# Patient Record
Sex: Female | Born: 1950 | Race: Black or African American | Hispanic: No | Marital: Single | Smoking: Never smoker
Health system: Southern US, Community
[De-identification: ages and names within clinical notes are randomized; demographics above are authoritative.]

## PROBLEM LIST (undated history)

## (undated) DIAGNOSIS — E079 Disorder of thyroid, unspecified: Secondary | ICD-10-CM

## (undated) DIAGNOSIS — E119 Type 2 diabetes mellitus without complications: Secondary | ICD-10-CM

## (undated) DIAGNOSIS — I10 Essential (primary) hypertension: Secondary | ICD-10-CM

## (undated) HISTORY — PX: THYROIDECTOMY: SHX17

---

## 2003-11-04 ENCOUNTER — Ambulatory Visit: Payer: Self-pay | Admitting: Family Medicine

## 2003-11-09 ENCOUNTER — Ambulatory Visit: Payer: Self-pay | Admitting: *Deleted

## 2003-11-09 ENCOUNTER — Ambulatory Visit: Payer: Self-pay | Admitting: Family Medicine

## 2006-03-13 ENCOUNTER — Emergency Department (HOSPITAL_COMMUNITY): Admission: EM | Admit: 2006-03-13 | Discharge: 2006-03-14 | Payer: Self-pay | Admitting: Emergency Medicine

## 2006-06-07 ENCOUNTER — Emergency Department (HOSPITAL_COMMUNITY): Admission: EM | Admit: 2006-06-07 | Discharge: 2006-06-07 | Payer: Self-pay | Admitting: Family Medicine

## 2017-04-21 ENCOUNTER — Encounter (HOSPITAL_COMMUNITY): Payer: Self-pay | Admitting: Emergency Medicine

## 2017-04-21 DIAGNOSIS — R1031 Right lower quadrant pain: Secondary | ICD-10-CM | POA: Diagnosis present

## 2017-04-21 DIAGNOSIS — E039 Hypothyroidism, unspecified: Secondary | ICD-10-CM | POA: Diagnosis not present

## 2017-04-21 DIAGNOSIS — I10 Essential (primary) hypertension: Secondary | ICD-10-CM | POA: Insufficient documentation

## 2017-04-21 DIAGNOSIS — E119 Type 2 diabetes mellitus without complications: Secondary | ICD-10-CM | POA: Diagnosis not present

## 2017-04-21 LAB — URINALYSIS, ROUTINE W REFLEX MICROSCOPIC
BILIRUBIN URINE: NEGATIVE
Bacteria, UA: NONE SEEN
Glucose, UA: NEGATIVE mg/dL
Hgb urine dipstick: NEGATIVE
Ketones, ur: NEGATIVE mg/dL
Nitrite: NEGATIVE
PH: 5 (ref 5.0–8.0)
Protein, ur: NEGATIVE mg/dL
SPECIFIC GRAVITY, URINE: 1.017 (ref 1.005–1.030)

## 2017-04-21 NOTE — ED Triage Notes (Signed)
Reports having right side flank pain continuously for the last three months.  Was seen in [place] diagnosed with muscle spasms.  Reports continued pain even after using oxycodone, ibuprofen, and lidocaine patch.

## 2017-04-21 NOTE — ED Notes (Signed)
Pt unable to void at this time. 

## 2017-04-22 ENCOUNTER — Emergency Department (HOSPITAL_COMMUNITY): Payer: Medicaid - Out of State

## 2017-04-22 ENCOUNTER — Emergency Department (HOSPITAL_COMMUNITY)
Admission: EM | Admit: 2017-04-22 | Discharge: 2017-04-22 | Disposition: A | Discharge status: Home / Self Care | Payer: Medicaid - Out of State | Attending: Emergency Medicine | Admitting: Emergency Medicine

## 2017-04-22 ENCOUNTER — Encounter (HOSPITAL_COMMUNITY): Payer: Self-pay

## 2017-04-22 DIAGNOSIS — R109 Unspecified abdominal pain: Secondary | ICD-10-CM

## 2017-04-22 HISTORY — DX: Type 2 diabetes mellitus without complications: E11.9

## 2017-04-22 HISTORY — DX: Disorder of thyroid, unspecified: E07.9

## 2017-04-22 HISTORY — DX: Essential (primary) hypertension: I10

## 2017-04-22 LAB — CBC
HCT: 33.6 % — ABNORMAL LOW (ref 36.0–46.0)
Hemoglobin: 11 g/dL — ABNORMAL LOW (ref 12.0–15.0)
MCH: 30.6 pg (ref 26.0–34.0)
MCHC: 32.7 g/dL (ref 30.0–36.0)
MCV: 93.3 fL (ref 78.0–100.0)
PLATELETS: 354 10*3/uL (ref 150–400)
RBC: 3.6 MIL/uL — AB (ref 3.87–5.11)
RDW: 12.9 % (ref 11.5–15.5)
WBC: 6.1 10*3/uL (ref 4.0–10.5)

## 2017-04-22 LAB — COMPREHENSIVE METABOLIC PANEL
ALBUMIN: 3.9 g/dL (ref 3.5–5.0)
ALT: 14 U/L (ref 14–54)
AST: 39 U/L (ref 15–41)
Alkaline Phosphatase: 36 U/L — ABNORMAL LOW (ref 38–126)
Anion gap: 8 (ref 5–15)
BUN: 16 mg/dL (ref 6–20)
CHLORIDE: 103 mmol/L (ref 101–111)
CO2: 26 mmol/L (ref 22–32)
CREATININE: 0.86 mg/dL (ref 0.44–1.00)
Calcium: 8.9 mg/dL (ref 8.9–10.3)
GFR calc Af Amer: >60 mL/min (ref >60)
Glucose, Bld: 143 mg/dL — ABNORMAL HIGH (ref 65–99)
POTASSIUM: 5.8 mmol/L — AB (ref 3.5–5.1)
SODIUM: 137 mmol/L (ref 135–145)
Total Bilirubin: 1.1 mg/dL (ref 0.3–1.2)
Total Protein: 5.9 g/dL — ABNORMAL LOW (ref 6.5–8.1)

## 2017-04-22 MED ORDER — MORPHINE SULFATE (PF) 4 MG/ML IV SOLN
4.0000 mg | Freq: Once | INTRAVENOUS | Status: AC
  Administered 2017-04-22: 4 mg via INTRAVENOUS
  Filled 2017-04-22: qty 1

## 2017-04-22 MED ORDER — SODIUM CHLORIDE 0.9 % IV BOLUS (SEPSIS)
1000.0000 mL | Freq: Once | INTRAVENOUS | Status: AC
  Administered 2017-04-22: 1000 mL via INTRAVENOUS

## 2017-04-22 MED ORDER — IOPAMIDOL (ISOVUE-300) INJECTION 61%
INTRAVENOUS | Status: AC
  Administered 2017-04-22: 100 mL
  Filled 2017-04-22: qty 100

## 2017-04-22 NOTE — ED Provider Notes (Signed)
[facility] Provider Note   CSN: [id number] Arrival date & time: 04/21/17  2232     History   Chief Complaint Chief Complaint  Patient presents with  . Flank Pain    HPI Amber Hanna is a 67 y.o. female.  HPI Patient is a 67 year old female presents to the emergency department with complaints of 2 months of constant right flank and right sided pain.  She states it seems worse at night.  She reports that she has had this worked up in [place] and no clear etiology is found.  She recently moved down to live with her daughter.  She reports that even light touch seems to bother her skin in her right lower quadrant.  No recent shingles or rash outbreak across to her right abdomen and right flank.  No history of kidney stones.  Denies dysuria or urinary frequency.  No chest pain or shortness of breath.  Denies fevers or chills.  Denies nausea vomiting diarrhea.  Appetite is been normal.  Stools been normal.  No discoloration of her stools.  Pain is mild to moderate in severity at this time.  Currently without a primary care physician in the region   Past Medical History:  Diagnosis Date  . Diabetes mellitus without complication (HCC)   . Hypertension   . Thyroid disease     There are no active problems to display for this patient.   Past Surgical History:  Procedure Laterality Date  . THYROIDECTOMY      OB History    No data available       Home Medications    Prior to Admission medications   Not on File    Family History No family history on file.  Social History Social History   Tobacco Use  . Smoking status: Never Smoker  . Smokeless tobacco: Never Used  Substance Use Topics  . Alcohol use: No    Frequency: Never  . Drug use: No     Allergies   Penicillins   Review of Systems Review of Systems  All other systems reviewed and are negative.    Physical Exam Updated Vital Signs BP (!) 141/87   Pulse 74    Temp 99.1 F (37.3 C) (Oral)   Resp 16   Ht 5\' 10"  (1.778 m)   Wt 77.1 kg (170 lb)   SpO2 98%   BMI 24.39 kg/m   Physical Exam  Constitutional: She is oriented to person, place, and time. She appears well-developed and well-nourished. No distress.  HENT:  Head: Normocephalic and atraumatic.  Eyes: EOM are normal.  Neck: Normal range of motion.  Cardiovascular: Normal rate, regular rhythm and normal heart sounds.  Pulmonary/Chest: Effort normal and breath sounds normal.  Abdominal: Soft. She exhibits no distension. There is no tenderness.  Musculoskeletal: Normal range of motion.  Neurological: She is alert and oriented to person, place, and time.  Skin: Skin is warm and dry.  No signs of zoster across her right flank or right abdomen  Psychiatric: She has a normal mood and affect. Judgment normal.  Nursing note and vitals reviewed.    ED Treatments / Results  Labs (all labs ordered are listed, but only abnormal results are displayed) Labs Reviewed  URINALYSIS, ROUTINE W REFLEX MICROSCOPIC - Abnormal; Notable for the following components:      Result Value   Leukocytes, UA SMALL (*)    Squamous Epithelial / LPF 0-5 (*)  All other components within normal limits  CBC - Abnormal; Notable for the following components:   RBC 3.60 (*)    Hemoglobin 11.0 (*)    HCT 33.6 (*)    All other components within normal limits  COMPREHENSIVE METABOLIC PANEL - Abnormal; Notable for the following components:   Potassium 5.8 (*)    Glucose, Bld 143 (*)    Total Protein 5.9 (*)    Alkaline Phosphatase 36 (*)    All other components within normal limits  URINE CULTURE    EKG  EKG Interpretation None       Radiology Ct Abdomen Pelvis W Contrast  Result Date: 04/22/2017 CLINICAL DATA:  Right abdominal/flank pain for 3 months. EXAM: CT ABDOMEN AND PELVIS WITH CONTRAST TECHNIQUE: Multidetector CT imaging of the abdomen and pelvis was performed using the standard protocol following  bolus administration of intravenous contrast. CONTRAST:  ISOVUE-300 IOPAMIDOL (ISOVUE-300) INJECTION 61% COMPARISON:  None. FINDINGS: Lower chest: Scattered basilar atelectasis. No pleural fluid or confluent consolidation. Hepatobiliary: No focal liver abnormality is seen. No gallstones, gallbladder wall thickening, or biliary dilatation. Pancreas: No ductal dilatation or inflammation. Spleen: Normal in size without focal abnormality. Adrenals/Urinary Tract: Normal adrenal glands. No hydronephrosis or perinephric edema. Homogeneous renal enhancement with symmetric excretion on delayed phase imaging. Urinary bladder is partially distended without wall thickening. Stomach/Bowel: Lack of enteric contrast limits bowel assessment. Stomach is nondistended. No bowel wall thickening, inflammatory change or obstruction. Low lying cecum in the midline pelvis. Normal appendix courses into the left abdomen. Moderate colonic stool burden. No colonic wall thickening or inflammatory change. Vascular/Lymphatic: No significant vascular findings are present. No enlarged abdominal or pelvic lymph nodes. Reproductive: Uterus and bilateral adnexa are unremarkable. Other: Peripherally calcified 2 cm density abutting the anti mesenteric border of the cecum in the pelvis is nonspecific, no associated inflammatory change. No free air, free fluid, or intra-abdominal fluid collection. Tiny fat containing umbilical hernia. Musculoskeletal: There are no acute or suspicious osseous abnormalities. Facet arthropathy in the lower lumbar spine. IMPRESSION: 1. No acute abnormality or explanation for abdominal pain. 2. Low lying cecum in the midline of the pelvis. Peripherally calcified density adjacent to the anterior cecum is nonspecific but has benign imaging features. This may reflect sequela of fat necrosis or epiploic appendagitis. No inflammatory change at this time. Electronically Signed   By: Rubye Oaks M.D.   On: 04/22/2017 03:26     Procedures Procedures (including critical care time)  Medications Ordered in ED Medications  morphine 4 MG/ML injection 4 mg (4 mg Intravenous Given 04/22/17 0213)  sodium chloride 0.9 % bolus 1,000 mL (0 mLs Intravenous Stopped 04/22/17 0342)  iopamidol (ISOVUE-300) 61 % injection (100 mLs  Contrast Given 04/22/17 0256)     Initial Impression / Assessment and Plan / ED Course  I have reviewed the triage vital signs and the nursing notes.  Pertinent labs & imaging results that were available during my care of the patient were reviewed by me and considered in my medical decision making (see chart for details).     No signs of shingles at this time.  Workup in the emergency department without significant abnormality.  She reports even light touch seems to bother her skin in her right lower quadrant.  This is some sort of neuropathic type pain.  Patient is requesting a GI referral.  She may benefit from colonoscopy.  She will need a primary care physician and will need to continue working on this discomfort  at home.  I do not think she needs additional testing at this time.  She does not need acute hospitalization.  Patient and daughter understand to return to the emergency department for new or worsening symptoms  Final Clinical Impressions(s) / ED Diagnoses   Final diagnoses:  Right flank pain    ED Discharge Orders    None       Azalia Bilis, MD 04/22/17 [phone]

## 2017-04-22 NOTE — ED Notes (Signed)
Pt understood dc material. NAD noted. 

## 2017-04-22 NOTE — ED Notes (Signed)
Patient transported to CT 

## 2017-04-23 ENCOUNTER — Encounter: Payer: Self-pay | Admitting: Physician Assistant

## 2017-04-23 LAB — URINE CULTURE: CULTURE: NO GROWTH

## 2017-04-24 ENCOUNTER — Other Ambulatory Visit: Payer: Self-pay | Admitting: Family Medicine

## 2017-04-24 ENCOUNTER — Ambulatory Visit
Admission: RE | Admit: 2017-04-24 | Discharge: 2017-04-24 | Disposition: A | Discharge status: Home / Self Care | Payer: PRIVATE HEALTH INSURANCE | Source: Ambulatory Visit | Attending: Family Medicine | Admitting: Family Medicine

## 2017-04-24 DIAGNOSIS — M545 Low back pain: Secondary | ICD-10-CM

## 2017-04-27 ENCOUNTER — Encounter (HOSPITAL_COMMUNITY): Payer: Self-pay | Admitting: Emergency Medicine

## 2017-04-27 ENCOUNTER — Other Ambulatory Visit: Payer: Self-pay

## 2017-04-27 ENCOUNTER — Emergency Department (HOSPITAL_COMMUNITY)
Admission: EM | Admit: 2017-04-27 | Discharge: 2017-04-27 | Disposition: A | Discharge status: Home / Self Care | Payer: Medicaid - Out of State | Attending: Emergency Medicine | Admitting: Emergency Medicine

## 2017-04-27 DIAGNOSIS — R1031 Right lower quadrant pain: Secondary | ICD-10-CM | POA: Diagnosis present

## 2017-04-27 DIAGNOSIS — E119 Type 2 diabetes mellitus without complications: Secondary | ICD-10-CM | POA: Insufficient documentation

## 2017-04-27 DIAGNOSIS — Z7984 Long term (current) use of oral hypoglycemic drugs: Secondary | ICD-10-CM | POA: Insufficient documentation

## 2017-04-27 DIAGNOSIS — M792 Neuralgia and neuritis, unspecified: Secondary | ICD-10-CM | POA: Diagnosis not present

## 2017-04-27 DIAGNOSIS — I10 Essential (primary) hypertension: Secondary | ICD-10-CM | POA: Diagnosis not present

## 2017-04-27 LAB — COMPREHENSIVE METABOLIC PANEL
ALBUMIN: 4.1 g/dL (ref 3.5–5.0)
ALT: 15 U/L (ref 14–54)
ANION GAP: 11 (ref 5–15)
AST: 17 U/L (ref 15–41)
Alkaline Phosphatase: 39 U/L (ref 38–126)
BILIRUBIN TOTAL: 0.8 mg/dL (ref 0.3–1.2)
BUN: 13 mg/dL (ref 6–20)
CO2: 24 mmol/L (ref 22–32)
Calcium: 9.3 mg/dL (ref 8.9–10.3)
Chloride: 104 mmol/L (ref 101–111)
Creatinine, Ser: 0.84 mg/dL (ref 0.44–1.00)
Glucose, Bld: 149 mg/dL — ABNORMAL HIGH (ref 65–99)
POTASSIUM: 4.5 mmol/L (ref 3.5–5.1)
Sodium: 139 mmol/L (ref 135–145)
TOTAL PROTEIN: 6.8 g/dL (ref 6.5–8.1)

## 2017-04-27 LAB — CBC
HEMATOCRIT: 35.6 % — AB (ref 36.0–46.0)
HEMOGLOBIN: 11.6 g/dL — AB (ref 12.0–15.0)
MCH: 30 pg (ref 26.0–34.0)
MCHC: 32.6 g/dL (ref 30.0–36.0)
MCV: 92 fL (ref 78.0–100.0)
Platelets: 360 10*3/uL (ref 150–400)
RBC: 3.87 MIL/uL (ref 3.87–5.11)
RDW: 12.6 % (ref 11.5–15.5)
WBC: 6.4 10*3/uL (ref 4.0–10.5)

## 2017-04-27 LAB — URINALYSIS, ROUTINE W REFLEX MICROSCOPIC
BACTERIA UA: NONE SEEN
Bilirubin Urine: NEGATIVE
GLUCOSE, UA: NEGATIVE mg/dL
HGB URINE DIPSTICK: NEGATIVE
Ketones, ur: NEGATIVE mg/dL
NITRITE: NEGATIVE
Protein, ur: NEGATIVE mg/dL
Specific Gravity, Urine: 1.01 (ref 1.005–1.030)
pH: 6 (ref 5.0–8.0)

## 2017-04-27 LAB — LIPASE, BLOOD: Lipase: 50 U/L (ref 11–51)

## 2017-04-27 MED ORDER — HYDROMORPHONE HCL 1 MG/ML IJ SOLN
1.0000 mg | Freq: Once | INTRAMUSCULAR | Status: AC
  Administered 2017-04-27: 1 mg via INTRAMUSCULAR
  Filled 2017-04-27: qty 1

## 2017-04-27 MED ORDER — OSELTAMIVIR PHOSPHATE 75 MG PO CAPS: 75.0000 mg | ORAL_CAPSULE | Freq: Two times a day (BID) | ORAL | 0 refills | Status: DC

## 2017-04-27 MED ORDER — ONDANSETRON 4 MG PO TBDP
8.0000 mg | ORAL_TABLET | Freq: Once | ORAL | Status: AC
  Administered 2017-04-27: 8 mg via ORAL
  Filled 2017-04-27: qty 2

## 2017-04-27 MED ORDER — CAPSAICIN 0.075 % EX CREA: 1.0000 "application " | TOPICAL_CREAM | Freq: Two times a day (BID) | CUTANEOUS | 0 refills | Status: DC

## 2017-04-27 MED ORDER — GABAPENTIN 100 MG PO CAPS: ORAL_CAPSULE | ORAL | 0 refills | Status: DC

## 2017-04-27 NOTE — ED Triage Notes (Signed)
Patient reports chronic right lateral/RLQ pain radiating to mid back for 3 months , denies emesis or diarrhea , no injury or urinary discomfort.

## 2017-04-27 NOTE — ED Provider Notes (Addendum)
[facility] EMERGENCY DEPARTMENT Provider Note   CSN: [id number] Arrival date & time: 04/27/17  0304     History   Chief Complaint Chief Complaint  Patient presents with  . Abdominal Pain  . Back Pain    HPI Amber Hanna is a 67 y.o. female.  Patient presents to the emergency department for evaluation of right-sided flank and abdominal pain.  Patient reports that this has been persistent for several months.  She recently moved here from [place], had workup for this prior to coming to [place].  She was also seen in this emergency department 5 days ago and had blood work, urinalysis, CAT scan performed and no acute abnormalities were noted.      Past Medical History:  Diagnosis Date  . Diabetes mellitus without complication (HCC)   . Hypertension   . Thyroid disease     There are no active problems to display for this patient.   Past Surgical History:  Procedure Laterality Date  . THYROIDECTOMY      OB History    No data available       Home Medications    Prior to Admission medications   Medication Sig Start Date End Date Taking? Authorizing Provider  baclofen (LIORESAL) 10 MG tablet Take 10 mg by mouth 3 (three) times daily.   Yes [provider]  Brinzolamide-Brimonidine 1-0.2 % SUSP Place 1 drop into both eyes 3 (three) times daily.   Yes [provider]  lisinopril (PRINIVIL,ZESTRIL) 20 MG tablet Take 20 mg by mouth daily.   Yes [provider]  metFORMIN (GLUCOPHAGE) 1000 MG tablet Take 1,000 mg by mouth 2 (two) times daily with a meal.   Yes [provider]  capsicum (ZOSTRIX) 0.075 % topical cream Apply 1 application topically 2 (two) times daily. 04/27/17   Gilda Crease, MD  gabapentin (NEURONTIN) 100 MG capsule Take 1 tablet on the first day, then take 1 tablet every 12 hours on the second day, then start taking 1 tablet 3 times a day after that 04/27/17   Gilda Crease, MD     Family History No family history on file.  Social History Social History   Tobacco Use  . Smoking status: Never Smoker  . Smokeless tobacco: Never Used  Substance Use Topics  . Alcohol use: No    Frequency: Never  . Drug use: No     Allergies   Penicillins   Review of Systems Review of Systems  Genitourinary: Positive for flank pain.  All other systems reviewed and are negative.    Physical Exam Updated Vital Signs BP (!) 156/95   Pulse 78   Temp 100.3 F (37.9 C) (Oral)   Resp 16   Ht 5\' 7"  (1.702 m)   Wt 77.1 kg (170 lb)   SpO2 96%   BMI 26.63 kg/m   Physical Exam  Constitutional: She is oriented to person, place, and time. She appears well-developed and well-nourished. No distress.  HENT:  Head: Normocephalic and atraumatic.  Right Ear: Hearing normal.  Left Ear: Hearing normal.  Nose: Nose normal.  Mouth/Throat: Oropharynx is clear and moist and mucous membranes are normal.  Eyes: Conjunctivae and EOM are normal. Pupils are equal, round, and reactive to light.  Neck: Normal range of motion. Neck supple.  Cardiovascular: Regular rhythm, S1 normal and S2 normal. Exam reveals no gallop and no friction rub.  No murmur heard. Pulmonary/Chest: Effort normal and breath sounds normal.  No respiratory distress. She exhibits no tenderness.  Abdominal: Soft. Normal appearance and bowel sounds are normal. There is no hepatosplenomegaly. There is no tenderness. There is no rebound, no guarding, no tenderness at McBurney's point and negative Murphy's sign. No hernia.  Patient does have tenderness of the right side of her abdomen but it wraps around her right flank to the back area following a dermatome.  The area is exquisitely tender even to very light touch of the skin.  Musculoskeletal: Normal range of motion.  Neurological: She is alert and oriented to person, place, and time. She has normal strength. No cranial nerve deficit or sensory deficit. Coordination  normal. GCS eye subscore is 4. GCS verbal subscore is 5. GCS motor subscore is 6.  Skin: Skin is warm, dry and intact. No rash noted. No cyanosis.  Psychiatric: She has a normal mood and affect. Her speech is normal and behavior is normal. Thought content normal.  Nursing note and vitals reviewed.    ED Treatments / Results  Labs (all labs ordered are listed, but only abnormal results are displayed) Labs Reviewed  COMPREHENSIVE METABOLIC PANEL - Abnormal; Notable for the following components:      Result Value   Glucose, Bld 149 (*)    All other components within normal limits  CBC - Abnormal; Notable for the following components:   Hemoglobin 11.6 (*)    HCT 35.6 (*)    All other components within normal limits  URINALYSIS, ROUTINE W REFLEX MICROSCOPIC - Abnormal; Notable for the following components:   Leukocytes, UA SMALL (*)    Squamous Epithelial / LPF 0-5 (*)    All other components within normal limits  LIPASE, BLOOD    EKG  EKG Interpretation None       Radiology No results found.  Procedures Procedures (including critical care time)  Medications Ordered in ED Medications  HYDROmorphone (DILAUDID) injection 1 mg (1 mg Intramuscular Given 04/27/17 0512)  ondansetron (ZOFRAN-ODT) disintegrating tablet 8 mg (8 mg Oral Given 04/27/17 [id number])     Initial Impression / Assessment and Plan / ED Course  I have reviewed the triage vital signs and the nursing notes.  Pertinent labs & imaging results that were available during my care of the patient were reviewed by me and considered in my medical decision making (see chart for details).     Patient's presentation seems to be neuropathic in nature.  She has exquisite tenderness to even light touch of the skin and she does have a dermatomal distribution.  She has never had a rash in this area, but zoster and post herpetic neuralgia without rash is certainly high on the list of possibilities.  She has tried Lidoderm patches  without improvement.  Will initiate Neurontin and capsaicin, refer back to primary care doctor.  She has been trying oxycodone, ibuprofen and other oral medications.  Patient and daughter told that she likely will require referral to a pain management doctor.  She does not require any further workup here as she had recently had a CT scan that was entirely negative.  She did have a temperature of 100.3 today.  She does not have any infectious symptoms, appears well, no sign of sepsis.  Her urinalysis did have 6-30 white blood cells, however this is identical to the urinalysis from 5 days ago.  Culture from that visit is negative.  Addendum: Recheck at 6:30 AM Patient has had almost complete relief with her pain.  She states "I feel 100%  better".  Asked about the fever, she has not had any fever symptoms or any symptoms to explain a fever.  Denies cough, chest congestion.  Discussed with her that she has been in the ER a couple of times in other doctor's offices recently and flu is very prevalent right now.  We will give her a prescription for Tamiflu, she will fill this if she gets flu symptoms to go along with this low-grade fever.  Final Clinical Impressions(s) / ED Diagnoses   Final diagnoses:  Neuralgia    ED Discharge Orders        Ordered    capsicum (ZOSTRIX) 0.075 % topical cream  2 times daily     04/27/17 0618    gabapentin (NEURONTIN) 100 MG capsule     04/27/17 0618       Gilda Crease, MD 04/27/17 1610    Gilda Crease, MD 04/27/17 0630

## 2017-04-27 NOTE — ED Notes (Signed)
Went to go d/c patient, and bp cuff, all belongings gone from room. Pt left without getting d/c instructions

## 2017-05-01 ENCOUNTER — Encounter: Payer: Self-pay | Admitting: Physician Assistant

## 2017-05-01 ENCOUNTER — Ambulatory Visit (INDEPENDENT_AMBULATORY_CARE_PROVIDER_SITE_OTHER): Payer: PRIVATE HEALTH INSURANCE | Admitting: Physician Assistant

## 2017-05-01 VITALS — BP 140/77 | HR 100 | Ht 67.0 in | Wt 165.0 lb

## 2017-05-01 DIAGNOSIS — M792 Neuralgia and neuritis, unspecified: Secondary | ICD-10-CM

## 2017-05-01 NOTE — Patient Instructions (Signed)
We will refer you to [facility] for neuropathic pain. They will contact you with an appointment.

## 2017-05-01 NOTE — Progress Notes (Signed)
Chief Complaint: Right-sided abdominal pain  HPI:    Amber Hanna is a 67 year old African-American female with a past medical history of diabetes and others listed below, who presents to clinic today for follow-up after being seen in the ER for right-sided abdominal pain.    ER visit 04/22/17 with complaint of 2 months of constant right flank and right sided pain, worse at night.  Worked up in [place] with no clear etiology.  Even light touch seem to bother her skin on her right lower quadrant.  CBC with a hemoglobin 11 and otherwise normal.  CMP with potassium 5.8, alk phos 36 and otherwise normal.  CT abdomen and pelvis with contrast with no acute abnormality or explanation for abdominal pain, low-lying cecum in the midline of the pelvis.  Peripherally calcified density adjacent to the anterior cecum is nonspecific but has benign imaging features.  Could reflect sequela of fat necrosis or epiploic appendage otitis.  No inflammatory change.  Referred to [place] for possible colonoscopy.    EGD 04/27/17 for right-sided abdominal pain.  Persistent for months.  Thought neuropathic as she had exquisite tenderness to even light touch of the skin and did have a dermatomal distribution.  Had tried Lidoderm patches without improvement.  Neurontin and capsaicin was initiated patient referred back to primary care doctor.  Have been trying oxycodone, ibuprofen and other oral meds.  Discussed that she would need referral to a pain management doctor.    Today, presents clinic accompanied by her daughter and explains that the capsaicin has offered her some relief though this "wears off and makes the right side of my abdomen tight".  Describes 4 months of pain on the right side of her abdomen which wraps around to her back and to her bellybutton, constant but worse at night making the patient unable to sleep.  Was evaluated in [place] initially with normal x-ray per patient and started on ibuprofen and oxycodone which "only  sort of help".  Describes this pain as a "burning/itching", sometimes places a warm towel on her abdomen which seems to help.  Also describes stinging in this area.  Sometimes just her clothes touching it hurts.    Last colonoscopy 5-6 years ago in [place].    Denies fever, chills, weight loss, anorexia, nausea, vomiting, abdominal pain, change in bowel habits, blood in her stool or melena.  Past Medical History:  Diagnosis Date  . Diabetes mellitus without complication ([place])   . Hypertension   . Thyroid disease     Past Surgical History:  Procedure Laterality Date  . THYROIDECTOMY      Current Outpatient Medications  Medication Sig Dispense Refill  . baclofen (LIORESAL) 10 MG tablet Take 10 mg by mouth 3 (three) times daily.    . Brinzolamide-Brimonidine 1-0.2 % SUSP Place 1 drop into both eyes 3 (three) times daily.    . capsicum (ZOSTRIX) 0.075 % topical cream Apply 1 application topically 2 (two) times daily. 28.3 g 0  . gabapentin (NEURONTIN) 100 MG capsule Take 1 tablet on the first day, then take 1 tablet every 12 hours on the second day, then start taking 1 tablet 3 times a day after that 90 capsule 0  . levothyroxine (SYNTHROID, LEVOTHROID) 88 MCG tablet Take 88 mcg by mouth daily before breakfast.    . lisinopril (PRINIVIL,ZESTRIL) 20 MG tablet Take 20 mg by mouth daily.    . metFORMIN (GLUCOPHAGE) 1000 MG tablet Take 1,000 mg by mouth 2 (two)  times daily with a meal.     No current facility-administered medications for this visit.     Allergies as of 05/01/2017 - Review Complete 05/01/2017  Allergen Reaction Noted  . Penicillins Itching 04/21/2017    Family History  Problem Relation Age of Onset  . Diabetes Mother   . Diabetes Sister     Social History   Socioeconomic History  . Marital status: Single    Spouse name: Not on file  . Number of children: Not on file  . Years of education: Not on file  . Highest education level: Not on file  Social Needs  .  Financial resource strain: Not on file  . Food insecurity - worry: Not on file  . Food insecurity - inability: Not on file  . Transportation needs - medical: Not on file  . Transportation needs - non-medical: Not on file  Occupational History  . Not on file  Tobacco Use  . Smoking status: Never Smoker  . Smokeless tobacco: Never Used  Substance and Sexual Activity  . Alcohol use: No    Frequency: Never  . Drug use: No  . Sexual activity: Not on file  Other Topics Concern  . Not on file  Social History Narrative  . Not on file    Review of Systems:    Constitutional: No weight loss, fever or chills Skin: No rash  Cardiovascular: No chest pain Respiratory: No SOB  Gastrointestinal: See HPI and otherwise negative Genitourinary: No dysuria Neurological: No headache, dizziness or syncope Musculoskeletal: No new muscle or joint pain Hematologic: No bleeding  Psychiatric: No history of depression or anxiety   Physical Exam:  Vital signs: BP 140/77   Pulse 100   Ht _0  (1.702 m)   Wt 165 lb (74.8 kg)   SpO2 100%   BMI 25.84 kg/m   Constitutional:   Pleasant AA female appears to be in NAD, Well developed, Well nourished, alert and cooperative Head:  Normocephalic and atraumatic. Eyes:   PEERL, EOMI. No icterus. Conjunctiva pink. Ears:  Normal auditory acuity. Neck:  Supple Throat: Oral cavity and pharynx without inflammation, swelling or lesion.  Respiratory: Respirations even and unlabored. Lungs clear to auscultation bilaterally.   No wheezes, crackles, or rhonchi.  Cardiovascular: Normal S1, S2. No MRG. Regular rate and rhythm. No peripheral edema, cyanosis or pallor.  Gastrointestinal:  Soft, nondistended, nontender. No rebound or guarding. Normal bowel sounds. No appreciable masses or hepatomegaly. Rectal:  Not performed.  Msk:  Symmetrical without gross deformities. Without edema, no deformity or joint abnormality.  Neurologic:  Alert and  oriented x4;  grossly  normal neurologically.  Skin:   Dry and intact without significant lesions or rashes. Dermatomal distribution of tenderness on right flank to umbilicus and around to back to even light palpation Psychiatric:  Demonstrates good judgement and reason without abnormal affect or behaviors.  RELEVANT LABS AND IMAGING: CBC    Component Value Date/Time   WBC 6.4 04/27/2017 0320   RBC 3.87 04/27/2017 0320   HGB 11.6 (L) 04/27/2017 0320   HCT 35.6 (L) 04/27/2017 0320   PLT 360 04/27/2017 0320   MCV 92.0 04/27/2017 0320   MCH 30.0 04/27/2017 0320   MCHC 32.6 04/27/2017 0320   RDW 12.6 04/27/2017 0320    CMP     Component Value Date/Time   NA 139 04/27/2017 0320   K 4.5 04/27/2017 0320   CL 104 04/27/2017 0320   CO2 24 04/27/2017 0320   GLUCOSE  149 (H) 04/27/2017 0320   BUN 13 04/27/2017 0320   CREATININE 0.84 04/27/2017 0320   CALCIUM 9.3 04/27/2017 0320   PROT 6.8 04/27/2017 0320   ALBUMIN 4.1 04/27/2017 0320   AST 17 04/27/2017 0320   ALT 15 04/27/2017 0320   ALKPHOS 39 04/27/2017 0320   BILITOT 0.8 04/27/2017 0320   GFRNONAA >60 04/27/2017 0320   GFRAA >60 04/27/2017 0320   EXAM: CT ABDOMEN AND PELVIS WITH CONTRAST 04/22/17  TECHNIQUE: Multidetector CT imaging of the abdomen and pelvis was performed using the standard protocol following bolus administration of intravenous contrast.  CONTRAST:  171m ISOVUE-300 IOPAMIDOL (ISOVUE-300) INJECTION 61%  COMPARISON:  None.  FINDINGS: Lower chest: Scattered basilar atelectasis. No pleural fluid or confluent consolidation.  Hepatobiliary: No focal liver abnormality is seen. No gallstones, gallbladder wall thickening, or biliary dilatation.  Pancreas: No ductal dilatation or inflammation.  Spleen: Normal in size without focal abnormality.  Adrenals/Urinary Tract: Normal adrenal glands. No hydronephrosis or perinephric edema. Homogeneous renal enhancement with symmetric excretion on delayed phase imaging. Urinary  bladder is partially distended without wall thickening.  Stomach/Bowel: Lack of enteric contrast limits bowel assessment. Stomach is nondistended. No bowel wall thickening, inflammatory change or obstruction. Low lying cecum in the midline pelvis. Normal appendix courses into the left abdomen. Moderate colonic stool burden. No colonic wall thickening or inflammatory change.  Vascular/Lymphatic: No significant vascular findings are present. No enlarged abdominal or pelvic lymph nodes.  Reproductive: Uterus and bilateral adnexa are unremarkable.  Other: Peripherally calcified 2 cm density abutting the anti mesenteric border of the cecum in the pelvis is nonspecific, no associated inflammatory change. No free air, free fluid, or intra-abdominal fluid collection. Tiny fat containing umbilical hernia.  Musculoskeletal: There are no acute or suspicious osseous abnormalities. Facet arthropathy in the lower lumbar spine.  IMPRESSION: 1. No acute abnormality or explanation for abdominal pain. 2. Low lying cecum in the midline of the pelvis. Peripherally calcified density adjacent to the anterior cecum is nonspecific but has benign imaging features. This may reflect sequela of fat necrosis or epiploic appendagitis. No inflammatory change at this time.   Electronically Signed   By: MJeb LeveringM.D.   On: 04/22/2017 03:26  EXAM: LUMBAR SPINE - COMPLETE 4+ VIEW 04/24/17  COMPARISON:  CT abdomen and pelvis 04/22/2017  FINDINGS: 5 non-rib-bearing lumbar vertebra.  Facet degenerative changes at L4-L5 and L5-S1.  Osseous mineralization appears mildly decreased.  Minimal anterolisthesis at L4-L5 likely due to facet degenerative changes.  Vertebral body heights maintained without fracture or additional subluxation.  No bone destruction or spondylolysis.  SI joints preserved.  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  Otherwise negative exam.   Electronically Signed   By: MLavonia DanaM.D.   On: 04/24/2017 16:37  Assessment: 1.  Neuropathic pain: Pain in a dermatomal distribution on surface of skin, recent CT abdomen pelvis normal as well as labs, lumbar spine x-ray does show mild degenerative facet disease changes of the lower lumbar spine with minimal anterior listhesis at L4-L5, pain some better with capsaicin cream, question relation to this vs known diabetes vs possibly non-rash shingles  Plan: 1.  Would recommend referral to another PCP for second opinion on how to help with this neuropathic pain. 2.  Requesting [place] which was normal.  I do not believe patient needs a colonoscopy for further workup  of this pain. 3.  Patient to follow in clinic with [place] as needed/directed after records received as above.  Assigned to Dr. Havery Moros today.  Amber Newer, PA-C [facility] 05/01/2017, 3:00 PM

## 2017-05-02 NOTE — Progress Notes (Signed)
Agree with assessment and plan as outlined. Sounds like neuropathic / abdominal wall pain based on evaluation. He may want to consider lidocaine patches or trial of gabapentin or cymbalta, any of those would be fine. I would also consider referral to pain management for trigger point injection. Await results of last colonoscopy.

## 2017-05-26 NOTE — Progress Notes (Deleted)
[facility] [address] [place], [place] [address] Phone: [phone] Subjective:    I'm seeing this patient by the request  of:  Leilani Able, MD  Hyacinth Meeker PA   CC: Right-sided abdominal pain  [id number]  Amber Hanna is a 67 y.o. female coming in with complaint of right-sided abdominal pain.  History of diabetes.  Patient has had emergency visit with more of her right flank pain that started March 3.  Patient states that she has been seen in [place] with no significant findings.  Was having pain to even light touch in the area.  Patient has had advanced imaging including a CT abdomen and pelvis that was independently visualized by me showing no significant acute abnormality.  Patient did see gastroenterology and did have an EGD done on April 27, 2017.  This was fairly unremarkable as well.  Has tried many different modalities including Lidoderm.  Patient has been given gabapentin and capsaicin topically and there was some mild improvement.  Patient is given given pain medications that did not seem to help including oxycodone.  Onset-  Location Duration-  Character- Aggravating factors- Reliving factors-  Therapies tried-  Severity-   X-rays of the lumbar spine were also independently visualized by me showing very mild osteoarthritic changes at L4-L5  Past Medical History:  Diagnosis Date  . Diabetes mellitus without complication ([facility])   . Hypertension   . Thyroid disease    Past Surgical History:  Procedure Laterality Date  . THYROIDECTOMY     Social History   Socioeconomic History  . Marital status: Single    Spouse name: Not on file  . Number of children: Not on file  . Years of education: Not on file  . Highest education level: Not on file  Occupational History  . Not on file  Social Needs  . Financial resource strain: Not on file  . Food insecurity:    Worry: Not on file    Inability: Not on file  . Transportation  needs:    Medical: Not on file    Non-medical: Not on file  Tobacco Use  . Smoking status: Never Smoker  . Smokeless tobacco: Never Used  Substance and Sexual Activity  . Alcohol use: No    Frequency: Never  . Drug use: No  . Sexual activity: Not on file  Lifestyle  . Physical activity:    Days per week: Not on file    Minutes per session: Not on file  . Stress: Not on file  Relationships  . Social connections:    Talks on phone: Not on file    Gets together: Not on file    Attends religious service: Not on file    Active member of club or organization: Not on file    Attends meetings of clubs or organizations: Not on file    Relationship status: Not on file  Other Topics Concern  . Not on file  Social History Narrative  . Not on file   Allergies  Allergen Reactions  . Penicillins Itching   Family History  Problem Relation Age of Onset  . Diabetes Mother   . Diabetes Sister      Past medical history, social, surgical and family history all reviewed in electronic medical record.  No pertanent information unless stated regarding to the chief complaint.   Review of Systems:Review of systems updated and as accurate as of 05/26/17  No headache, visual changes, nausea,  vomiting, diarrhea, constipation, dizziness, abdominal pain, skin rash, fevers, chills, night sweats, weight loss, swollen lymph nodes, body aches, joint swelling, muscle aches, chest pain, shortness of breath, mood changes.   Objective  There were no vitals taken for this visit. Systems examined below as of 05/26/17   General: No apparent distress alert and oriented x3 mood and affect normal, dressed appropriately.  HEENT: Pupils equal, extraocular movements intact  Respiratory: Patient's speak in full sentences and does not appear short of breath  Cardiovascular: No lower extremity edema, non tender, no erythema  Skin: Warm dry intact with no signs of infection or rash on extremities or on axial  skeleton.  Abdomen: Soft nontender  Neuro: Cranial nerves II through XII are intact, neurovascularly intact in all extremities with 2+ DTRs and 2+ pulses.  Lymph: No lymphadenopathy of posterior or anterior cervical chain or axillae bilaterally.  Gait normal with good balance and coordination.  MSK:  Non tender with full range of motion and good stability and symmetric strength and tone of shoulders, elbows, wrist, hip, knee and ankles bilaterally.     Impression and Recommendations:     This case required medical decision making of moderate complexity.      Note: This dictation was prepared with Dragon dictation along with smaller phrase technology. Any transcriptional errors that result from this process are unintentional.

## 2017-05-28 ENCOUNTER — Ambulatory Visit: Payer: PRIVATE HEALTH INSURANCE | Admitting: Family Medicine

## 2017-05-28 DIAGNOSIS — Z0289 Encounter for other administrative examinations: Secondary | ICD-10-CM

## 2020-01-10 IMAGING — CR DG LUMBAR SPINE COMPLETE 4+V
5 series · 5 of 5 positions shown · non-contrast
Comparison: CT abdomen and pelvis 04/22/2017

CLINICAL DATA: Low back and RIGHT hip pain for several months, no
injury

EXAM:
LUMBAR SPINE - COMPLETE 4+ VIEW

[t l-spine a.p.]
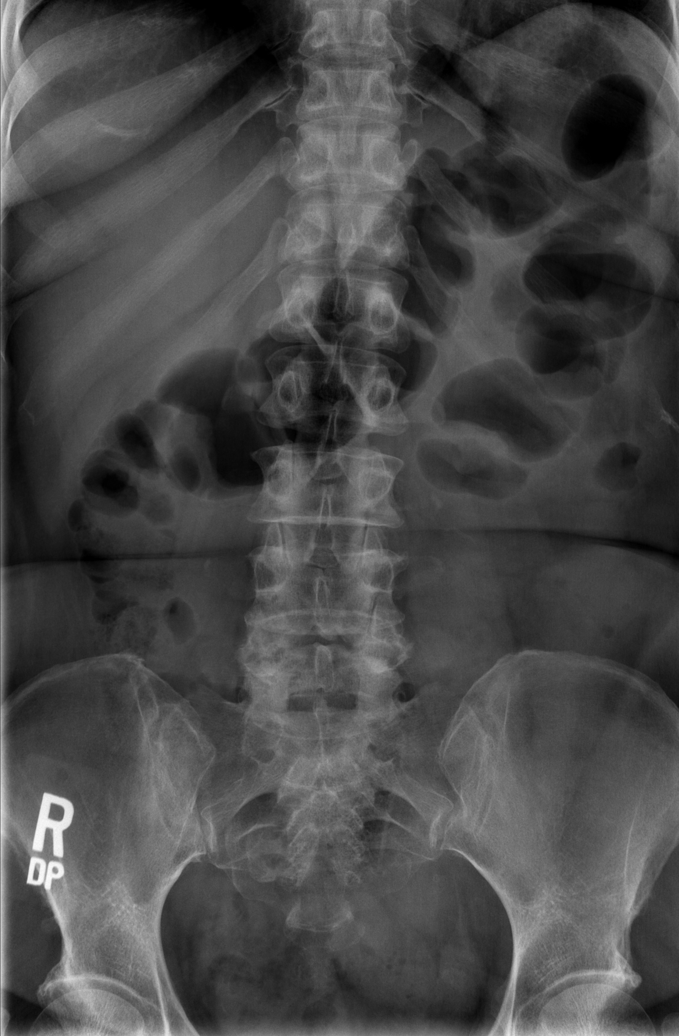

[t l-spine oblique exposure (1 of 2)]
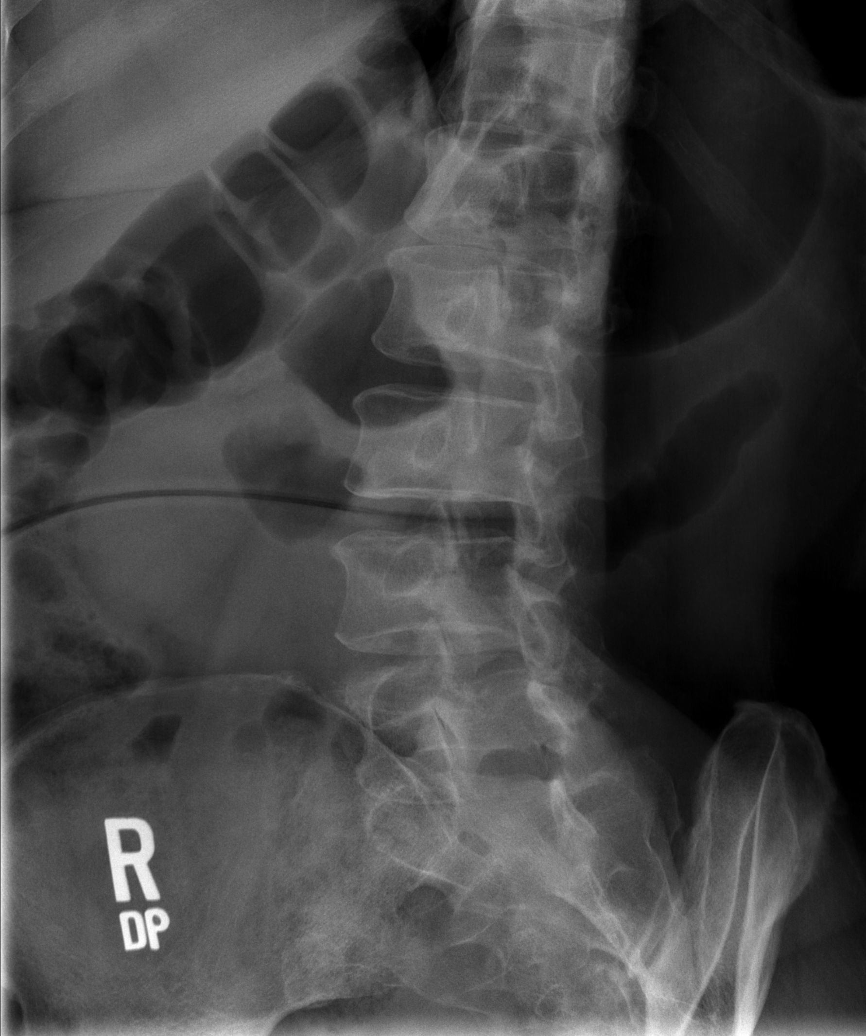

[t l-spine oblique exposure (2 of 2)]
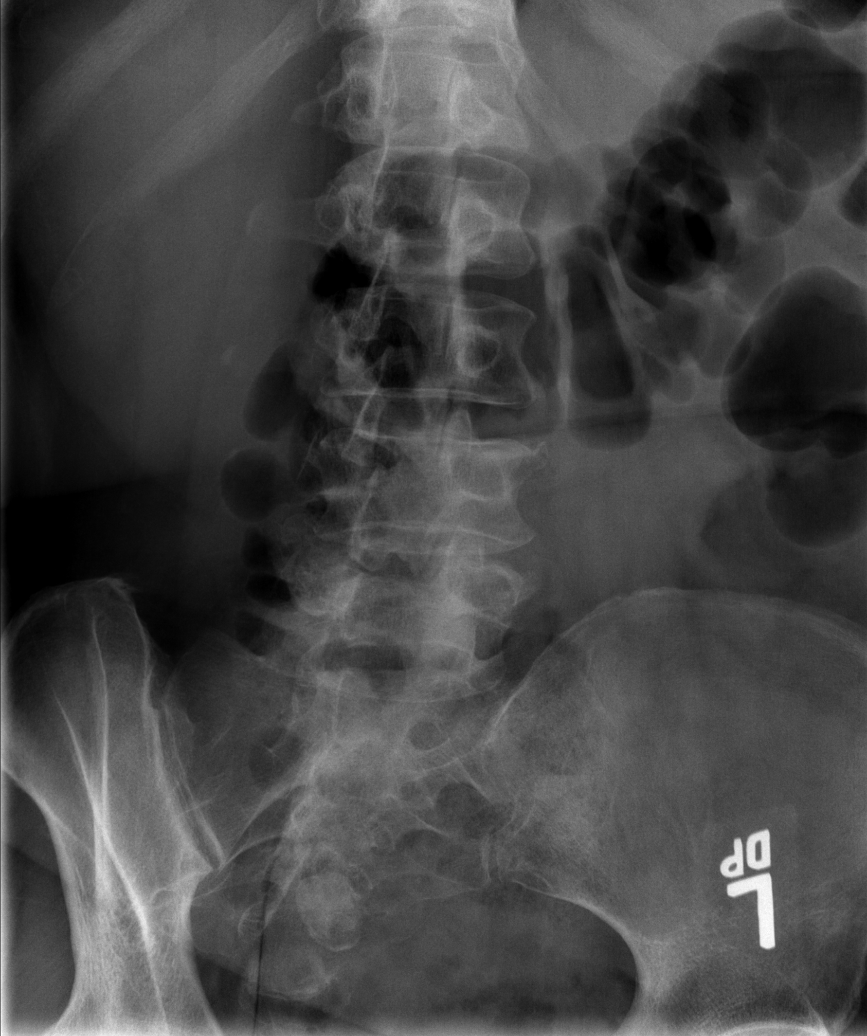

[t l-spine lat]
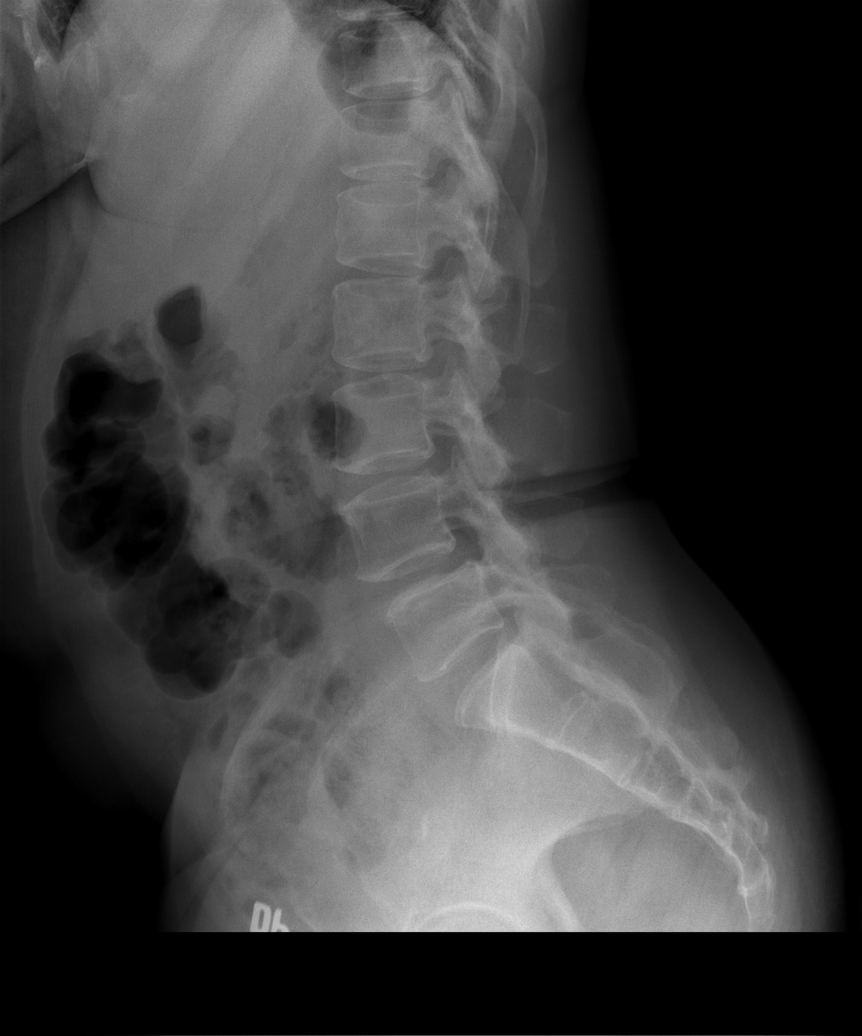

[t l-spine l5-s1 spot]
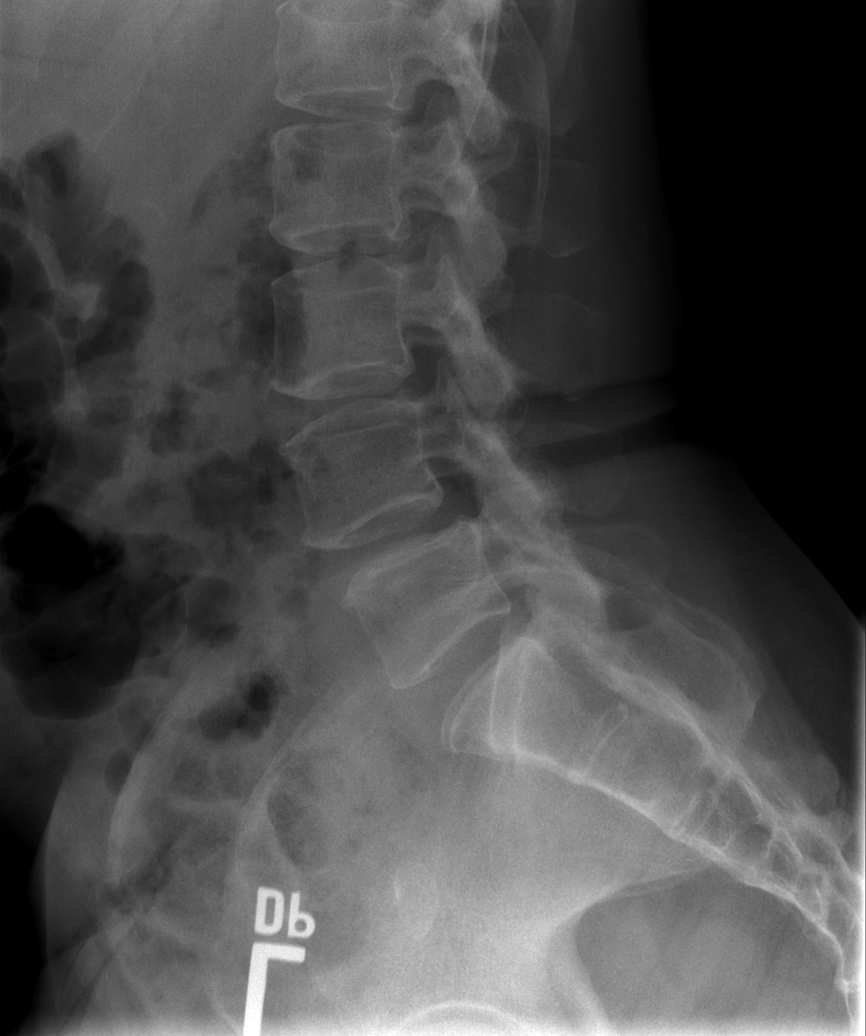

[5 of 5 positions shown; findings below may reference images not displayed]

FINDINGS: 5 non-rib-bearing lumbar vertebra.

Facet degenerative changes at L4-L5 and L5-S1.

Osseous mineralization appears mildly decreased.

Minimal anterolisthesis at L4-L5 likely due to facet degenerative
changes.

Vertebral body heights maintained without fracture or additional
subluxation.

No bone destruction or spondylolysis.

SI joints preserved.
IMPRESSION: Mild degenerative facet disease changes of the lower lumbar spine
with minimal anterolisthesis at L4-L5.

Otherwise negative exam.

## 2021-09-08 ENCOUNTER — Encounter (HOSPITAL_COMMUNITY): Payer: Self-pay | Admitting: Emergency Medicine

## 2021-09-08 ENCOUNTER — Emergency Department (HOSPITAL_COMMUNITY)
Admission: EM | Admit: 2021-09-08 | Discharge: 2021-09-09 | Disposition: A | Discharge status: Home / Self Care | Payer: Medicaid - Out of State | Attending: Emergency Medicine | Admitting: Emergency Medicine

## 2021-09-08 ENCOUNTER — Other Ambulatory Visit: Payer: Self-pay

## 2021-09-08 ENCOUNTER — Emergency Department (HOSPITAL_COMMUNITY): Payer: Medicaid - Out of State

## 2021-09-08 DIAGNOSIS — Z7984 Long term (current) use of oral hypoglycemic drugs: Secondary | ICD-10-CM | POA: Diagnosis not present

## 2021-09-08 DIAGNOSIS — Z79899 Other long term (current) drug therapy: Secondary | ICD-10-CM | POA: Diagnosis not present

## 2021-09-08 DIAGNOSIS — I10 Essential (primary) hypertension: Secondary | ICD-10-CM | POA: Insufficient documentation

## 2021-09-08 DIAGNOSIS — G629 Polyneuropathy, unspecified: Secondary | ICD-10-CM

## 2021-09-08 DIAGNOSIS — E1140 Type 2 diabetes mellitus with diabetic neuropathy, unspecified: Secondary | ICD-10-CM | POA: Insufficient documentation

## 2021-09-08 DIAGNOSIS — R202 Paresthesia of skin: Secondary | ICD-10-CM | POA: Diagnosis present

## 2021-09-08 NOTE — ED Provider Triage Note (Signed)
Emergency Medicine Provider Triage Evaluation Note  Amber Hanna , a 71 y.o. female  was evaluated in triage.  Pt complains of bilateral shoulder pain.  Worse on the left.  Both ongoing for about a month.  States she lifted her arms above her head to stop the bus and had sudden onset of pain.  Has persisted since.  Denies shortness of breath.  States pain radiates to bilateral breast.  Pain is reproducible with range of motion.  Review of Systems  Positive: As above Negative: As above  Physical Exam  BP (!) 159/83   Pulse 83   Temp 98.4 F (36.9 C) (Oral)   Resp 16   SpO2 98%  Gen:   Awake, no distress   Resp:  Normal effort  MSK:   Moves extremities without difficulty  Other:    Medical Decision Making  Medically screening exam initiated at 8:56 PM.  Appropriate orders placed.  Amber Hanna was informed that the remainder of the evaluation will be completed by another provider, this initial triage assessment does not replace that evaluation, and the importance of remaining in the ED until their evaluation is complete.     Marita [place], PA-C 09/08/21 2057

## 2021-09-08 NOTE — ED Triage Notes (Signed)
Pt reported to ED with c/o bilateral hand pain and tingling x1 month. Pt states that pain in left hadn is worse than right. States pain starts at shoulders and radiates downwards.

## 2021-09-09 LAB — TROPONIN I (HIGH SENSITIVITY): Troponin I (High Sensitivity): 4 ng/L (ref <18)

## 2021-09-09 MED ORDER — GABAPENTIN 100 MG PO CAPS
100.0000 mg | ORAL_CAPSULE | Freq: Once | ORAL | Status: AC
  Administered 2021-09-09: 100 mg via ORAL
  Filled 2021-09-09: qty 1

## 2021-09-09 MED ORDER — GABAPENTIN 100 MG PO CAPS: ORAL_CAPSULE | ORAL | 0 refills | Status: DC

## 2021-09-09 NOTE — Discharge Instructions (Addendum)
Your tingling and numbness sensation is likely due to neuropathy.  Please take gabapentin as prescribed and follow-up closely with your doctor for further care.  Return if you have any concern.

## 2021-09-09 NOTE — ED Notes (Signed)
Pt verbalized understanding of d/c instructions, meds, and followup care. Denies questions. VSS, no distress noted. Steady gait to exit with all belongings.  ?

## 2021-09-09 NOTE — ED Provider Notes (Signed)
[facility] EMERGENCY DEPARTMENT Provider Note   CSN: [id number] Arrival date & time: 09/08/21  2020     History  Chief Complaint  Patient presents with   Hand Pain    Amber Hanna is a 71 y.o. female.  The history is provided by the patient and medical records. No language interpreter was used.  Hand Pain    71 year old female with history of diabetes, hypertension, thyroid disease, presenting complaining of arms discomfort.  Patient reports she has been experiencing bilateral hand tingling sensation ongoing for the past month.  Pain also radiates towards her up to her shoulders bilaterally left greater than right.  She experiences the symptoms on a regular basis worse at nighttime.  She tries to sleep it off without adequate relief.  She denies any specific injury she denies headache, neck pain, any trauma, no chest pain trouble breathing.  She denies any fever chills and did not notice any rash.  She denies any arm weakness or dropping objects.  She has history neuralgia in the past.  Home Medications Prior to Admission medications   Medication Sig Start Date End Date Taking? Authorizing Provider  baclofen (LIORESAL) 10 MG tablet Take 10 mg by mouth 3 (three) times daily.    [provider]  Brinzolamide-Brimonidine 1-0.2 % SUSP Place 1 drop into both eyes 3 (three) times daily.    [provider]  capsicum (ZOSTRIX) 0.075 % topical cream Apply 1 application topically 2 (two) times daily. 04/27/17   Gilda Crease, MD  gabapentin (NEURONTIN) 100 MG capsule Take 1 tablet on the first day, then take 1 tablet every 12 hours on the second day, then start taking 1 tablet 3 times a day after that 04/27/17   Gilda Crease, MD  levothyroxine (SYNTHROID, LEVOTHROID) 88 MCG tablet Take 88 mcg by mouth daily before breakfast.    [provider]  lisinopril (PRINIVIL,ZESTRIL) 20 MG tablet Take 20 mg by mouth daily.    [provider]  metFORMIN (GLUCOPHAGE) 1000 MG tablet Take 1,000 mg by mouth 2 (two) times daily with a meal.    [provider]      Allergies    Penicillins and Penicillin g    Review of Systems   Review of Systems  All other systems reviewed and are negative.   Physical Exam Updated Vital Signs BP 138/76   Pulse 75   Temp 98.5 F (36.9 C)   Resp 18   SpO2 98%  Physical Exam Vitals and nursing note reviewed.  Constitutional:      General: She is not in acute distress.    Appearance: She is well-developed.  HENT:     Head: Atraumatic.  Eyes:     Conjunctiva/sclera: Conjunctivae normal.  Neck:     Comments: No midline cervical spine tenderness Pulmonary:     Effort: Pulmonary effort is normal.  Musculoskeletal:        General: Tenderness (Mild diffuse tenderness to bilateral upper extremities on gentle palpation without any overlying skin changes.  Normal grip strength bilaterally, full range of motion of all major joints radial pulse 2+) present.     Cervical back: Normal range of motion and neck supple.  Skin:    Findings: No rash.  Neurological:     Mental Status: She is alert.     Comments: 5 out of 5 strength to bilateral upper extremities  Psychiatric:        Mood and Affect: Mood  normal.     ED Results / Procedures / Treatments   Labs (all labs ordered are listed, but only abnormal results are displayed) Labs Reviewed  TROPONIN I (HIGH SENSITIVITY)  TROPONIN I (HIGH SENSITIVITY)    EKG None  Date: 09/09/2021  Rate: 72  Rhythm: normal sinus rhythm  QRS Axis: normal  Intervals: normal  ST/T Wave abnormalities: normal  Conduction Disutrbances: none  Narrative Interpretation:   Old EKG Reviewed: No significant changes noted    Radiology DG Shoulder Right  Result Date: 09/08/2021 CLINICAL DATA:  Bilateral shoulder pain, worse on the left. EXAM: RIGHT SHOULDER - 2+ VIEW COMPARISON:  None Available. FINDINGS: Focal area of sclerosis in  the right proximal humerus likely representing enchondroma. No periosteal or other malignant changes. No evidence of acute fracture or dislocation in the right shoulder. Soft tissues are unremarkable. 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 No acute bony abnormalities. Electronically Signed   By: Burman Nieves M.D.   On: 09/08/2021 21:43   DG Shoulder Left  Result Date: 09/08/2021 CLINICAL DATA:  Left shoulder pain EXAM: LEFT SHOULDER - 2+ VIEW COMPARISON:  None Available. FINDINGS: There is no evidence of fracture or dislocation. There is no evidence of arthropathy or other focal bone abnormality. Soft tissues are unremarkable. IMPRESSION: Negative. Electronically Signed   By: Burman Nieves M.D.   On: 09/08/2021 21:42    Procedures Procedures    Medications Ordered in ED Medications  gabapentin (NEURONTIN) capsule 100 mg (100 mg Oral Given 09/09/21 1659)    ED Course/ Medical Decision Making/ A&P                           Medical Decision Making Amount and/or Complexity of Data Reviewed ECG/medicine tests: ordered.  Risk Prescription drug management.   BP 138/76   Pulse 75   Temp 98.5 F (36.9 C)   Resp 18   SpO2 98%   3:40 PM This is a 71 year old female with history of neuropathy and history of diabetes presenting complaining of bilateral hand tingling sensation which radiates towards her arms and shoulder for approximately a month.  Seems to be an ongoing symptoms worse at nighttime.  She does not endorse any weakness she does not endorse any chest pain or neck pain.  No fever or chills.  She is not dropping objects.  No red flags.  On exam she does have some mild subjective tenderness throughout her arms bilaterally without any focal point tenderness.  There is no weakness to both arms, she has normal grip strength, radial pulse 2+, brisk cap refills, and no overlying skin changes to both upper extremities.  She does  not have any reproducible cervical spine tenderness.  She is afebrile, vital signs stable.  I have reviewed patient's prior EMR and considered my plan of care.  X-rays of bilateral shoulder was obtained and independently visualized interpreted by me and agree with radiology interpretation.  X-ray without any concerning features.  Her presentation is not consistent with cervical radiculopathy, ACS, infectious etiology, or vascular pathology.  I suspect her symptoms more likely to be peripheral neuropathy and plan to treat with gabapentin.  Encourage patient to follow-up with primary care provider for outpatient management.  I have reviewed patient's medication list and does not think that this is a drug related side effect.  5:24 PM I have abundance of precaution, an EKG and troponin was obtained and independently viewed  interpreted by me and are reassuring.  At this time patient stable to be discharged home.  This patient presents to the ED for concern of arm pain, this involves an extensive number of treatment options, and is a complaint that carries with it a high risk of complications and morbidity.  The differential diagnosis includes peripheral neuropathy, MSK strain, cervical radicular pain, MI, claudication, nerve palsy  Co morbidities that complicate the patient evaluation DM  HTN Additional history obtained:  Additional history obtained from patient External records from outside source obtained and reviewed including EMR including labs and imaging  Lab Tests:  I Ordered, and personally interpreted labs.  The pertinent results include:  as above  Imaging Studies ordered:  I ordered imaging studies including xray of bilateral shoulders I independently visualized and interpreted imaging which showed no acute finding I agree with the radiologist interpretation  Cardiac Monitoring:  The patient was maintained on a cardiac monitor.  I personally viewed and interpreted the cardiac  monitored which showed an underlying rhythm of: NSR  Medicines ordered and prescription drug management:  I ordered medication including gabapentin  for neuropathy Reevaluation of the patient after these medicines showed that the patient improved I have reviewed the patients home medicines and have made adjustments as needed  Test Considered: as above  Critical Interventions: as above  Consultations Obtained:  I requested consultation with the attending Dr. Roslynn Amble,  and discussed lab and imaging findings as well as pertinent plan - they recommend: checking trop  Problem List / ED Course: neuropathy  Reevaluation:  After the interventions noted above, I reevaluated the patient and found that they have :improved  Social Determinants of Health: none  Dispostion:  After consideration of the diagnostic results and the patients response to treatment, I feel that the patent would benefit from outpt f/u.         Final Clinical Impression(s) / ED Diagnoses Final diagnoses:  Neuropathy    Rx / DC Orders ED Discharge Orders          Ordered    gabapentin (NEURONTIN) 100 MG capsule        09/09/21 1725              Domenic Moras, PA-C 09/09/21 1725    Lucrezia Starch, MD 09/09/21 Curly Rim

## 2021-10-16 ENCOUNTER — Other Ambulatory Visit: Payer: Self-pay

## 2021-10-16 ENCOUNTER — Encounter (HOSPITAL_COMMUNITY): Payer: Self-pay | Admitting: Emergency Medicine

## 2021-10-16 ENCOUNTER — Emergency Department (HOSPITAL_COMMUNITY)
Admission: EM | Admit: 2021-10-16 | Discharge: 2021-10-16 | Disposition: A | Discharge status: Home / Self Care | Payer: Medicare (Managed Care) | Attending: Emergency Medicine | Admitting: Emergency Medicine

## 2021-10-16 DIAGNOSIS — E1165 Type 2 diabetes mellitus with hyperglycemia: Secondary | ICD-10-CM | POA: Insufficient documentation

## 2021-10-16 DIAGNOSIS — R739 Hyperglycemia, unspecified: Secondary | ICD-10-CM

## 2021-10-16 DIAGNOSIS — I10 Essential (primary) hypertension: Secondary | ICD-10-CM | POA: Insufficient documentation

## 2021-10-16 DIAGNOSIS — Z7984 Long term (current) use of oral hypoglycemic drugs: Secondary | ICD-10-CM | POA: Diagnosis not present

## 2021-10-16 DIAGNOSIS — Z79899 Other long term (current) drug therapy: Secondary | ICD-10-CM | POA: Diagnosis not present

## 2021-10-16 LAB — COMPREHENSIVE METABOLIC PANEL
ALT: 31 U/L (ref 0–44)
AST: 28 U/L (ref 15–41)
Albumin: 3.8 g/dL (ref 3.5–5.0)
Alkaline Phosphatase: 58 U/L (ref 38–126)
Anion gap: 6 (ref 5–15)
BUN: 15 mg/dL (ref 8–23)
CO2: 24 mmol/L (ref 22–32)
Calcium: 8.8 mg/dL — ABNORMAL LOW (ref 8.9–10.3)
Chloride: 106 mmol/L (ref 98–111)
Creatinine, Ser: 0.88 mg/dL (ref 0.44–1.00)
GFR, Estimated: >60 mL/min (ref >60)
Glucose, Bld: 199 mg/dL — ABNORMAL HIGH (ref 70–99)
Potassium: 4.5 mmol/L (ref 3.5–5.1)
Sodium: 136 mmol/L (ref 135–145)
Total Bilirubin: 0.7 mg/dL (ref 0.3–1.2)
Total Protein: 6.4 g/dL — ABNORMAL LOW (ref 6.5–8.1)

## 2021-10-16 LAB — CBC WITH DIFFERENTIAL/PLATELET
Abs Immature Granulocytes: 0.02 10*3/uL (ref 0.00–0.07)
Basophils Absolute: 0 10*3/uL (ref 0.0–0.1)
Basophils Relative: 1 %
Eosinophils Absolute: 0.1 10*3/uL (ref 0.0–0.5)
Eosinophils Relative: 2 %
HCT: 39.4 % (ref 36.0–46.0)
Hemoglobin: 12.8 g/dL (ref 12.0–15.0)
Immature Granulocytes: 0 %
Lymphocytes Relative: 37 %
Lymphs Abs: 2.2 10*3/uL (ref 0.7–4.0)
MCH: 29.5 pg (ref 26.0–34.0)
MCHC: 32.5 g/dL (ref 30.0–36.0)
MCV: 90.8 fL (ref 80.0–100.0)
Monocytes Absolute: 0.7 10*3/uL (ref 0.1–1.0)
Monocytes Relative: 11 %
Neutro Abs: 3 10*3/uL (ref 1.7–7.7)
Neutrophils Relative %: 49 %
Platelets: 320 10*3/uL (ref 150–400)
RBC: 4.34 MIL/uL (ref 3.87–5.11)
RDW: 12.6 % (ref 11.5–15.5)
WBC: 6 10*3/uL (ref 4.0–10.5)
nRBC: 0 % (ref 0.0–0.2)

## 2021-10-16 LAB — CBG MONITORING, ED: Glucose-Capillary: 185 mg/dL — ABNORMAL HIGH (ref 70–99)

## 2021-10-16 NOTE — ED Triage Notes (Signed)
Pt c/o high blood sugar at home of 219 today. Denies any sx. Hx of diabetes, takes Metformin.

## 2021-10-16 NOTE — ED Provider Triage Note (Signed)
Emergency Medicine Provider Triage Evaluation Note  Amber Hanna , a 71 y.o. female  was evaluated in triage.  Pt complains of high blood glucose.  Patient states that this morning she took her blood sugar and it was 203.  She states then she took it again later and it was 219.  This was concerning to her.  I did double check that she was not talking about her blood pressure and she assures me it is her blood sugar that is bothering her.  She denies any abdominal pain, nausea, vomiting, diarrhea or fevers.  She has been eating appropriately.  Taking her medications appropriately.  She denies any polyuria, polydipsia or polyuria or polyphagia  Review of Systems  Positive: See above Negative:   Physical Exam  BP 111/82 (BP Location: Right Arm)   Pulse 87   Temp 98.3 F (36.8 C) (Oral)   Resp 16   SpO2 100%  Gen:   Awake, no distress   Resp:  Normal effort  MSK:   Moves extremities without difficulty  Other:    Medical Decision Making  Medically screening exam initiated at 1:55 PM.  Appropriate orders placed.  Amber Hanna was informed that the remainder of the evaluation will be completed by another provider, this initial triage assessment does not replace that evaluation, and the importance of remaining in the ED until their evaluation is complete.     Amber Peru, PA-C 10/16/21 1356

## 2021-10-16 NOTE — ED Notes (Signed)
Gave pt grams crackers and peanut butter and water

## 2021-10-16 NOTE — ED Provider Notes (Signed)
[facility] EMERGENCY DEPARTMENT Provider Note  CSN: [id number] Arrival date & time: 10/16/21 1258  Chief Complaint(s) Hyperglycemia  HPI Amber Hanna is a 71 y.o. female with history of diabetes, hypertension presenting to the emergency department with hyperglycemia.  Patient reports that she checked her blood sugar morning and it was over 200.  She denies any symptoms such as polyuria, polydipsia, nausea, vomiting, shortness of breath, fevers, chills, dysuria, or any other symptoms.  She reports that she ate a lot of potatoes yesterday.  She reports compliance with her home medications.  Symptoms mild   Past Medical History Past Medical History:  Diagnosis Date   Diabetes mellitus without complication (HCC)    Hypertension    Thyroid disease    There are no problems to display for this patient.  Home Medication(s) Prior to Admission medications   Medication Sig Start Date End Date Taking? Authorizing Provider  baclofen (LIORESAL) 10 MG tablet Take 10 mg by mouth 3 (three) times daily.    [provider]  Brinzolamide-Brimonidine 1-0.2 % SUSP Place 1 drop into both eyes 3 (three) times daily.    [provider]  capsicum (ZOSTRIX) 0.075 % topical cream Apply 1 application topically 2 (two) times daily. 04/27/17   Gilda Crease, MD  gabapentin (NEURONTIN) 100 MG capsule Take 1 tablet on the first day, then take 1 tablet every 12 hours on the second day, then start taking 1 tablet 3 times a day after that 09/09/21   Fayrene Helper, PA-C  levothyroxine (SYNTHROID, LEVOTHROID) 88 MCG tablet Take 88 mcg by mouth daily before breakfast.    [provider]  lisinopril (PRINIVIL,ZESTRIL) 20 MG tablet Take 20 mg by mouth daily.    [provider]  metFORMIN (GLUCOPHAGE) 1000 MG tablet Take 1,000 mg by mouth 2 (two) times daily with a meal.    [provider]                                                                                                                                     Past Surgical History Past Surgical History:  Procedure Laterality Date   THYROIDECTOMY     Family History Family History  Problem Relation Age of Onset   Diabetes Mother    Diabetes Sister     Social History Social History   Tobacco Use   Smoking status: Never   Smokeless tobacco: Never  Substance Use Topics   Alcohol use: No   Drug use: No   Allergies Penicillins and Penicillin g  Review of Systems Review of Systems  All other systems reviewed and are negative.   Physical Exam Vital Signs  I have reviewed the triage vital signs BP 111/82 (BP Location: Right Arm)   Pulse 87   Temp 98.3 F (36.8 C) (Oral)   Resp 16   Ht 5\' 7"  (1.702 m)   Wt 82.6 kg  SpO2 100%   BMI 28.51 kg/m  Physical Exam Vitals and nursing note reviewed.  Constitutional:      General: She is not in acute distress.    Appearance: She is well-developed.  HENT:     Head: Normocephalic and atraumatic.     Mouth/Throat:     Mouth: Mucous membranes are moist.  Eyes:     Pupils: Pupils are equal, round, and reactive to light.  Cardiovascular:     Rate and Rhythm: Normal rate and regular rhythm.     Heart sounds: No murmur heard. Pulmonary:     Effort: Pulmonary effort is normal. No respiratory distress.     Breath sounds: Normal breath sounds.  Abdominal:     General: Abdomen is flat.     Palpations: Abdomen is soft.     Tenderness: There is no abdominal tenderness.  Musculoskeletal:        General: No tenderness.     Right lower leg: No edema.     Left lower leg: No edema.  Skin:    General: Skin is warm and dry.  Neurological:     General: No focal deficit present.     Mental Status: She is alert. Mental status is at baseline.  Psychiatric:        Mood and Affect: Mood normal.        Behavior: Behavior normal.     ED Results and Treatments Labs (all labs ordered are listed, but only abnormal results are  displayed) Labs Reviewed  COMPREHENSIVE METABOLIC PANEL - Abnormal; Notable for the following components:      Result Value   Glucose, Bld 199 (*)    Calcium 8.8 (*)    Total Protein 6.4 (*)    All other components within normal limits  CBG MONITORING, ED - Abnormal; Notable for the following components:   Glucose-Capillary 185 (*)    All other components within normal limits  CBC WITH DIFFERENTIAL/PLATELET                                                                                                                          Radiology No results found.  Pertinent labs & imaging results that were available during my care of the patient were reviewed by me and considered in my medical decision making (see MDM for details).  Medications Ordered in ED Medications - No data to display  Procedures Procedures  (including critical care time)  Medical Decision Making / ED Course   MDM:  71 year old female presenting to the emergency department with high blood sugar at home.  In the emergency department, the patient has high blood sugar 199, but is overall asymptomatic, has no focal physical exam symptoms or signs of dehydration, normal vital signs, and reassuring lab test without evidence of anion gap, elevated WBC count, acute kidney injury or dehydration.  Doubt DKA.  Advised patient follow-up closely with primary physician for recheck and A1c check, possible adjustment of diabetes medications.  No indication for diabetes medication adjustment currently. Will discharge patient to home. All questions answered. Patient comfortable with plan of discharge. Return precautions discussed with patient and specified on the after visit summary.       Additional history obtained: -External records from outside source obtained and reviewed including: Chart review  including previous notes, labs, imaging, consultation notes   Lab Tests: -I ordered, reviewed, and interpreted labs.   The pertinent results include:   Labs Reviewed  COMPREHENSIVE METABOLIC PANEL - Abnormal; Notable for the following components:      Result Value   Glucose, Bld 199 (*)    Calcium 8.8 (*)    Total Protein 6.4 (*)    All other components within normal limits  CBG MONITORING, ED - Abnormal; Notable for the following components:   Glucose-Capillary 185 (*)    All other components within normal limits  CBC WITH DIFFERENTIAL/PLATELET        Medicines ordered and prescription drug management: No orders of the defined types were placed in this encounter.   -I have reviewed the patients home medicines and have made adjustments as needed Cardiac Monitoring: The patient was maintained on a cardiac monitor.  I personally viewed and interpreted the cardiac monitored which showed an underlying rhythm of: NSR  Reevaluation: After the interventions noted above, I reevaluated the patient and found that they have improved  Co morbidities that complicate the patient evaluation  Past Medical History:  Diagnosis Date   Diabetes mellitus without complication (HCC)    Hypertension    Thyroid disease       Dispostion: Discharge    Final Clinical Impression(s) / ED Diagnoses Final diagnoses:  Hyperglycemia     This chart was dictated using voice recognition software.  Despite best efforts to proofread,  errors can occur which can change the documentation meaning.    Lonell Grandchild, MD 10/16/21 [phone]

## 2021-10-16 NOTE — Discharge Instructions (Signed)
Your blood sugar was elevated. You may need new medications for diabetes. Your lab testing was otherwise normal. Please follow up with your primary doctor as soon as possible.

## 2023-01-29 ENCOUNTER — Emergency Department (HOSPITAL_COMMUNITY)
Admission: EM | Admit: 2023-01-29 | Discharge: 2023-01-29 | Disposition: A | Discharge status: Home / Self Care | Payer: Medicare (Managed Care) | Attending: Emergency Medicine | Admitting: Emergency Medicine

## 2023-01-29 ENCOUNTER — Emergency Department (HOSPITAL_COMMUNITY): Payer: Medicare (Managed Care)

## 2023-01-29 ENCOUNTER — Encounter (HOSPITAL_COMMUNITY): Payer: Self-pay

## 2023-01-29 ENCOUNTER — Other Ambulatory Visit: Payer: Self-pay

## 2023-01-29 DIAGNOSIS — I10 Essential (primary) hypertension: Secondary | ICD-10-CM | POA: Diagnosis not present

## 2023-01-29 DIAGNOSIS — Z7984 Long term (current) use of oral hypoglycemic drugs: Secondary | ICD-10-CM | POA: Diagnosis not present

## 2023-01-29 DIAGNOSIS — Z79899 Other long term (current) drug therapy: Secondary | ICD-10-CM | POA: Insufficient documentation

## 2023-01-29 DIAGNOSIS — R519 Headache, unspecified: Secondary | ICD-10-CM

## 2023-01-29 LAB — CBC
HCT: 39.6 % (ref 36.0–46.0)
Hemoglobin: 12.8 g/dL (ref 12.0–15.0)
MCH: 29.5 pg (ref 26.0–34.0)
MCHC: 32.3 g/dL (ref 30.0–36.0)
MCV: 91.2 fL (ref 80.0–100.0)
Platelets: 312 10*3/uL (ref 150–400)
RBC: 4.34 MIL/uL (ref 3.87–5.11)
RDW: 12.4 % (ref 11.5–15.5)
WBC: 6.2 10*3/uL (ref 4.0–10.5)
nRBC: 0 % (ref 0.0–0.2)

## 2023-01-29 LAB — BASIC METABOLIC PANEL
Anion gap: 8 (ref 5–15)
BUN: 12 mg/dL (ref 8–23)
CO2: 26 mmol/L (ref 22–32)
Calcium: 9.4 mg/dL (ref 8.9–10.3)
Chloride: 103 mmol/L (ref 98–111)
Creatinine, Ser: 0.67 mg/dL (ref 0.44–1.00)
GFR, Estimated: >60 mL/min (ref >60)
Glucose, Bld: 202 mg/dL — ABNORMAL HIGH (ref 70–99)
Potassium: 4.1 mmol/L (ref 3.5–5.1)
Sodium: 137 mmol/L (ref 135–145)

## 2023-01-29 LAB — CBG MONITORING, ED: Glucose-Capillary: 179 mg/dL — ABNORMAL HIGH (ref 70–99)

## 2023-01-29 MED ORDER — ACETAMINOPHEN 500 MG PO TABS
1000.0000 mg | ORAL_TABLET | Freq: Once | ORAL | Status: AC
Start: 2023-01-29 — End: 2023-01-29
  Administered 2023-01-29: 1000 mg via ORAL
  Filled 2023-01-29: qty 2

## 2023-01-29 MED ORDER — LEVOTHYROXINE SODIUM 88 MCG PO TABS: 88.0000 ug | ORAL_TABLET | Freq: Every day | ORAL | 0 refills | Status: AC

## 2023-01-29 MED ORDER — AMLODIPINE BESYLATE 10 MG PO TABS: 10.0000 mg | ORAL_TABLET | Freq: Every day | ORAL | 0 refills | Status: AC

## 2023-01-29 MED ORDER — AMLODIPINE BESYLATE 5 MG PO TABS
10.0000 mg | ORAL_TABLET | Freq: Once | ORAL | Status: AC
Start: 2023-01-29 — End: 2023-01-29
  Administered 2023-01-29: 10 mg via ORAL
  Filled 2023-01-29: qty 2

## 2023-01-29 MED ORDER — LISINOPRIL 20 MG PO TABS: 20.0000 mg | ORAL_TABLET | Freq: Every day | ORAL | 0 refills | Status: AC

## 2023-01-29 MED ORDER — LISINOPRIL 10 MG PO TABS
10.0000 mg | ORAL_TABLET | Freq: Once | ORAL | Status: AC
  Administered 2023-01-29: 10 mg via ORAL
  Filled 2023-01-29: qty 1

## 2023-01-29 MED ORDER — IOHEXOL 350 MG/ML SOLN
75.0000 mL | Freq: Once | INTRAVENOUS | Status: AC | PRN
  Administered 2023-01-29: 75 mL via INTRAVENOUS

## 2023-01-29 NOTE — ED Notes (Signed)
Pt back from CT scan and in no obvious distress

## 2023-01-29 NOTE — ED Triage Notes (Signed)
Pt. Arrives POV for a headache that she states she has had all night. She states that it has been preventing her from getting any sleep. Denies nausea and vomiting. Denies vision changes and sensitivity to light. Hypertensive in triage. Hx of diabetes.

## 2023-01-29 NOTE — Discharge Instructions (Addendum)
Your CT angiogram showed a small area that is questionable for a tiny aneurysm.  At this time there would be no specific treatment.  You could have more tests but on but the most important thing is to control your blood pressure very well.  We discussed this extensively in the emergency department.    You are going to continue your lisinopril 20 mg every morning at the same time each day.  You are going to start amlodipine 10 mg daily and monitor your blood pressure 3-4 times a day at home and write the values down.  Try to take your blood pressure when you are relaxed and in a calm position.    Return to the emergency department immediately if you get a bad headache or any other concerning symptoms like a change in your vision, confusion or imbalance.    A referral has been made for you to help you establish follow-up and monitoring of your blood pressure.    Thank you for the opportunity to take care of you in our Emergency Department. You have been diagnosed with high blood pressure, also known as hypertension. This means that the force of blood against the walls of your blood vessels called is too strong. It also means that your heart has to work harder to move the blood. High blood pressure usually has no symptoms, but over time, it can cause serious health problems such as Heart attack and heart failure Stroke Kidney disease and failure Vision loss With the help from your healthcare provider and some important life style changes, you can manage your blood pressure and protect your health. Please read the instructions provided on hypertension, how to manage it and how to check your blood pressure. Additionally, use the blood pressure log provided to record your blood pressures. Take the blood pressure log with you to your primary care doctor so that they can adjust your blood pressure medications if needed. Please read the instructions on follow-up appointment. Return to the ER or Call 911  right away if you have any of these symptoms: Chest pain or shortness of breath Severe headache Weakness, tingling, or numbness of your face, arms, or legs (especially on 1 side of the body) Sudden change in vision Confusion, trouble speaking, or trouble understanding speech

## 2023-01-29 NOTE — ED Provider Notes (Signed)
[place] EMERGENCY DEPARTMENT AT [facility] Provider Note   CSN: [id number] Arrival date & time: 01/29/23  0547     History  Chief Complaint  Patient presents with   Headache    Amber Hanna is a 72 y.o. female.  HPI Patient reports has had a headache all night.  It started sometime before midnight.  Patient reports its on the left side of her head.  She indicates her temporal area.  Throbbing in quality.  She reports she is not be able to sleep all night because of the headache.  She denies any nausea or vomiting.  No visual changes.  No fever no chills.  No nasal congestion sinus pressure sore throat dental pain or earache.  No other associated symptoms.  Patient does have history of hypertension.  She reports she is compliant with her lisinopril.  She does not measure her blood pressures at home to know how they have been running.  Although reporting compliance with lisinopril, patient reports that she did take it when she developed her headache.  And it did not help.  She did not try other OTC pain medications.  Denies a history of frequent headaches.  No associated syncope or confusion.    Home Medications Prior to Admission medications   Medication Sig Start Date End Date Taking? Authorizing Provider  amLODipine (NORVASC) 10 MG tablet Take 1 tablet (10 mg total) by mouth daily. 01/29/23  Yes Arby Barrette, MD  levothyroxine (SYNTHROID) 88 MCG tablet Take 1 tablet (88 mcg total) by mouth daily before breakfast. 01/29/23  Yes Jabri Blancett, Lebron Conners, MD  lisinopril (ZESTRIL) 20 MG tablet Take 1 tablet (20 mg total) by mouth daily. 01/29/23  Yes Arby Barrette, MD  baclofen (LIORESAL) 10 MG tablet Take 10 mg by mouth 3 (three) times daily.    [provider]  Brinzolamide-Brimonidine 1-0.2 % SUSP Place 1 drop into both eyes 3 (three) times daily.    [provider]  capsicum (ZOSTRIX) 0.075 % topical cream Apply 1 application topically 2 (two) times daily.  04/27/17   Gilda Crease, MD  gabapentin (NEURONTIN) 100 MG capsule Take 1 tablet on the first day, then take 1 tablet every 12 hours on the second day, then start taking 1 tablet 3 times a day after that 09/09/21   Fayrene Helper, PA-C  levothyroxine (SYNTHROID, LEVOTHROID) 88 MCG tablet Take 88 mcg by mouth daily before breakfast.    [provider]  lisinopril (PRINIVIL,ZESTRIL) 20 MG tablet Take 20 mg by mouth daily.    [provider]  metFORMIN (GLUCOPHAGE) 1000 MG tablet Take 1,000 mg by mouth 2 (two) times daily with a meal.    [provider]      Allergies    Penicillins and Penicillin g    Review of Systems   Review of Systems  Physical Exam Updated Vital Signs BP (!) 151/82   Pulse 69   Temp 98 F (36.7 C)   Resp 18   SpO2 99%  Physical Exam Constitutional:      Comments: Alert nontoxic clinically well in appearance.  Well-nourished well-developed.  HENT:     Head: Normocephalic and atraumatic.     Comments: No focally reproducible pain.  No lesions in the scalp or face.    Right Ear: Tympanic membrane normal.     Left Ear: Tympanic membrane normal.     Nose: Nose normal.     Mouth/Throat:     Mouth: Mucous membranes  are moist.     Pharynx: Oropharynx is clear.     Comments: No apparent dental infection Eyes:     Extraocular Movements: Extraocular movements intact.     Pupils: Pupils are equal, round, and reactive to light.  Cardiovascular:     Rate and Rhythm: Normal rate and regular rhythm.  Pulmonary:     Effort: Pulmonary effort is normal.     Breath sounds: Normal breath sounds.  Abdominal:     General: There is no distension.     Palpations: Abdomen is soft.     Tenderness: There is no abdominal tenderness.  Musculoskeletal:        General: No swelling. Normal range of motion.     Cervical back: Neck supple.     Right lower leg: No edema.     Left lower leg: No edema.  Skin:    General: Skin is warm and dry.   Neurological:     General: No focal deficit present.     Mental Status: She is oriented to person, place, and time.     Cranial Nerves: No cranial nerve deficit.     Motor: No weakness.     Coordination: Coordination normal.     Gait: Gait normal.  Psychiatric:        Mood and Affect: Mood normal.     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  Headache. EXAM: CT ANGIOGRAPHY HEAD AND NECK WITH AND WITHOUT CONTRAST TECHNIQUE: Multidetector CT imaging of the head and neck was performed using the standard protocol during bolus administration of intravenous contrast. Multiplanar CT image reconstructions and MIPs were obtained to evaluate the vascular anatomy. Carotid stenosis measurements (when applicable) are obtained utilizing NASCET criteria, using the distal internal carotid diameter as the denominator. RADIATION DOSE REDUCTION: This exam was performed according to the departmental dose-optimization program which includes automated exposure control, adjustment of the mA and/or kV according to patient size and/or use of iterative reconstruction technique. CONTRAST:  75mL OMNIPAQUE IOHEXOL 350 MG/ML SOLN COMPARISON:  Head CT 01/29/2023. FINDINGS: CT HEAD FINDINGS Brain: No acute hemorrhage. Patchy hypoattenuation of the periventricular white matter, most consistent with mild chronic small-vessel disease. Cortical gray-white differentiation is otherwise preserved. No  hydrocephalus or extra-axial collection. No mass effect or midline shift. Vascular: No hyperdense vessel or unexpected calcification. Skull: No calvarial fracture or suspicious bone lesion. Skull base is unremarkable. Sinuses/Orbits: No acute finding. Other: None. Review of the MIP images confirms the above findings CTA NECK FINDINGS Aortic arch: Three-vessel arch configuration. Arch vessel origins are patent. Right carotid system: Moderate soft plaque along the proximal right cervical ICA results in less than 50% stenosis. Left carotid system: Moderate mixed plaque along the left carotid bulb and proximal left cervical ICA results in less than 50% stenosis. Vertebral arteries: Left dominant. No evidence of dissection, stenosis (50% or greater), aneurysm or occlusion. The right vertebral artery functionally terminates  in PICA. Skeleton: Disc-osteophyte complexes at C4-5 and C6-7 results in at least moderate spinal canal stenosis at each level. Other neck: Unremarkable. Upper chest: Unremarkable. Review of the MIP images confirms the above findings CTA HEAD FINDINGS Anterior circulation: Intracranial ICAs are patent without stenosis. 2 mm conical outpouching from the posterior aspect of the left carotid terminus, may represent a small aneurysm versus anterior choroidal artery infundibulum. The proximal ACAs and MCAs are patent without stenosis or aneurysm. Distal branches are symmetric. Posterior circulation: Normal basilar artery. The SCAs, AICAs and PICAs are patent proximally. The PCAs are patent proximally without stenosis or aneurysm. Distal branches are symmetric. Venous sinuses: As permitted by contrast timing, patent. Anatomic variants: Hypoplastic right A1 segment. Review of the MIP images confirms the above findings IMPRESSION: 1. No acute intracranial abnormality. Mild chronic small-vessel disease. 2. No large vessel occlusion or hemodynamically significant stenosis in the head or neck. 3. 2 mm conical  outpouching from the posterior aspect of the left carotid terminus, may represent a small aneurysm versus anterior choroidal artery infundibulum. 4. Disc-osteophyte complexes at C4-5 and C6-7 results in at least moderate spinal canal stenosis at each level. Electronically Signed   By: Orvan Falconer M.D.   On: 01/29/2023 10:47   CT Head Wo Contrast  Result Date: 01/29/2023 CLINICAL DATA:  Blunt poly trauma EXAM: CT HEAD WITHOUT CONTRAST TECHNIQUE: Contiguous axial images were obtained from the base of the skull through the vertex without intravenous contrast. RADIATION DOSE REDUCTION: This exam was performed according to the departmental dose-optimization program which includes automated exposure control, adjustment of the mA and/or kV according to patient size and/or use of iterative reconstruction technique. COMPARISON:  None Available. FINDINGS: Brain: No evidence of acute infarction, hemorrhage, hydrocephalus, extra-axial collection or mass lesion/mass effect. Mild periventricular low-density in the cerebral white matter for age. Vascular: No hyperdense vessel or unexpected calcification. Skull: Normal. Negative for fracture or focal lesion. Sinuses/Orbits: No acute finding. IMPRESSION: No evidence of injury. Electronically Signed   By: Tiburcio Pea M.D.   On: 01/29/2023 06:12    Procedures Procedures    Medications Ordered in ED Medications  acetaminophen (TYLENOL) tablet 1,000 mg (1,000 mg Oral Given 01/29/23 0844)  lisinopril (ZESTRIL) tablet 10 mg (10 mg Oral Given 01/29/23 0957)  iohexol (OMNIPAQUE) 350 MG/ML injection 75 mL (75 mLs Intravenous Contrast Given 01/29/23 1019)  amLODipine (NORVASC) tablet 10 mg (10 mg Oral Given 01/29/23 1208)    ED Course/ Medical Decision Making/ A&P                                 Medical Decision Making Amount and/or Complexity of Data Reviewed Labs: ordered. Radiology: ordered.  Risk OTC drugs. Prescription drug management.   Patient  presents with unilateral left-sided headache as outlined.  She describes a headache as acute onset yesterday evening.  It has been throbbing throughout the night.  No associated neurologic dysfunction.  Normal neurologic exam.  No associated infectious symptoms.  No meningismus no fever.  Patient's initial blood pressure was hypertensive.  By patient's history is unclear if blood pressures are controlled at baseline.  Differential diagnosis includes intracerebral hemorrhage\aneurysm\migraine or tension.  CT head no acute findings interpreted by radiology.  I also visually reviewed the study.  Given unilateral headache without history of similar headaches with any frequency lower probability represent migraine.  Will proceed with CT angiogram to rule out aneurysm/dissection.  10: 37  patient is returned from CT angio.  She reports headache is resolved.  Awaiting results.  CT angio interpretation by radiology shows possible vascular infundibulum versus very small aneurysm that the left carotid terminus.  Consult: Reviewed with Dr. Corliss Skains.  He has reviewed the CT imaging.  And agrees it is inconclusive whether this is a small aneurysm or a small vestibular infundibulum.  He advises a gold standard would be to do arterial angiogram to try to differentiate the 2 definitively.  However, based on CT findings patient would not be undergoing any therapeutic treatment.  Definitive management would be for blood pressure control and monitoring with careful return precautions.  Reassessed the patient and reviewed all the findings and management plan with her.  Her headache has resolved she feels much improved and blood pressures are down to systolic blood pressures of 151/82.  We discussed the CT finding and how important it is to have good blood pressure control.  The patient is visiting from out of town and planning to stay to take care of family members over the holiday season.  She will buy a blood pressure  cuff and we discussed taking the medication on an a.m. schedule at the same time.  She voiced understanding.  I also discussed not buying additional OTC herbal preparations and avoidance of NSAIDs.  Discharge instructions include extensive return precautions and home management as well as a follow-up plan.  Patient discharged pain-free in good condition with normal mental status.        Final Clinical Impression(s) / ED Diagnoses Final diagnoses:  Uncontrolled hypertension  Bad headache    Rx / DC Orders ED Discharge Orders          Ordered    lisinopril (ZESTRIL) 20 MG tablet  Daily        01/29/23 1158    amLODipine (NORVASC) 10 MG tablet  Daily        01/29/23 1158    levothyroxine (SYNTHROID) 88 MCG tablet  Daily before breakfast        01/29/23 1202    Referral to [facility] Care Management       Comments: Pharmacy Medication Management for Uncontrolled hypertension in the ED   01/29/23 1212              Arby Barrette, MD 01/29/23 1234

## 2023-01-29 NOTE — ED Notes (Signed)
Pt ambulated independently to the restroom.

## 2023-01-30 ENCOUNTER — Telehealth: Payer: Self-pay

## 2023-01-30 NOTE — Progress Notes (Unsigned)
   Care Guide Note  01/30/2023 Name: Amber Hanna MRN: [id number] DOB: 09-17-1950  Referred by: Patient, [name] Reason for referral : Care Coordination (Outreach to schedule with Pharm d )   KAYDON KALBFLEISCH is a 72 y.o. year old female who is a primary care patient of Patient, [name]. [name] was referred to the pharmacist for assistance related to HTN.    An unsuccessful telephone outreach was attempted today to contact the patient who was referred to the pharmacy team for assistance with medication management. Additional attempts will be made to contact the patient.   [name] , RMA     [facility]  [facility], [facility] Guide  Direct Dial: [phone]  Website: [email or web address]

## 2023-02-08 NOTE — Progress Notes (Signed)
Care Guide Pharmacy Note  02/08/2023 Name: Amber Hanna MRN: [id number] DOB: 05-26-1950  Referred By: Patient, [name] Reason for referral: Care Coordination (Outreach to schedule with Pharm d )   Amber Hanna is a 72 y.o. year old female who is a primary care patient of Patient, [name].  [name] was referred to the pharmacist for assistance related to: HTN  Successful contact was made with the patient to discuss pharmacy services including being ready for the pharmacist to call at least 5 minutes before the scheduled appointment time and to have medication bottles and any blood pressure readings ready for review. The patient agreed to meet with the pharmacist via telephone visit on (date/time).02/20/2023  [name] , RMA     [place]  [facility], [facility] Guide  Direct Dial: [phone]  Website: [email or web address]

## 2023-02-20 ENCOUNTER — Other Ambulatory Visit: Payer: Self-pay | Admitting: Pharmacist

## 2023-02-20 NOTE — Progress Notes (Signed)
   02/20/2023 Name: Amber Hanna MRN: [id number] DOB: 1950-11-13  Chief Complaint  Patient presents with   Hypertension    Amber Hanna is a 72 y.o. year old female who presented for a telephone visit.   They were referred to the pharmacist by  ED provider  for assistance in managing hypertension.    Subjective:  Care Team: Primary Care Provider: Patient, [name] ; Next Scheduled Visit: None Clinical Pharmacist: Aloysius Breeding, PharmD  Medication Access/Adherence  Current Pharmacy:  [facility] [phone] - [place], [place] - [address] AT [facility] [address] [place] [address] Phone: [phone] Fax: [phone]  [id number] - [place], [place] - [address] [place] [address] Phone: [phone] Fax: [phone]   Patient reports affordability concerns with their medications: No  Patient reports access/transportation concerns to their pharmacy: No  Patient reports adherence concerns with their medications:  No     Hypertension:  Current medications: Amlodipine  10mg  daily, Lisinopril  20mg  (had been taking 1/2 tablet) Medications previously tried: Unknown  Patient has a validated, automated, upper arm home BP cuff Current blood pressure readings readings: 143/76, 123/70 (afternoon)  Patient denies hypotensive s/sx including dizziness, lightheadedness.  Patient reports hypertensive symptoms including headache, chest pain, shortness of breath - Has had continued issues with headache- will take a Tylenol  and drink lemon water, which usually resolves it   Objective:  No results found for: HGBA1C  Lab Results  Component Value Date   CREATININE 0.67 01/29/2023   BUN 12 01/29/2023   NA 137 01/29/2023   K 4.1 01/29/2023   CL 103 01/29/2023   CO2 26 01/29/2023    No results found for: CHOL, HDL, LDLCALC, LDLDIRECT, TRIG, CHOLHDL  Medications Reviewed Today   Medications  were not reviewed in this encounter       Assessment/Plan:   Hypertension: - Currently uncontrolled - Reviewed long term cardiovascular and renal outcomes of uncontrolled blood pressure - Reviewed appropriate blood pressure monitoring technique and reviewed goal blood pressure. Recommended to check home blood pressure and heart rate daily and again with headache symptoms - Recommend to take additional half tablet of Lisinopril  currently if BP readings get high (systolic >150) - Patient reports being aware to monitor salt intake as this can cause worsened HTN   Follow Up Plan:  - No follow-up needed- patient is from [place], [place] (only came here for the holidays) - Strongly recommend she keep February visit with PCP to discuss concerns - Was taking Lisinopril  10mg  instead of 20mg  prescribed by ER- recommended further discussion with PCP based on readings reported today (also recommended bringing BP cuff to be checked)    Aloysius Breeding, PharmD [facility] Phone Number: [phone]

## 2023-03-03 ENCOUNTER — Other Ambulatory Visit: Payer: Self-pay

## 2023-03-03 ENCOUNTER — Emergency Department (HOSPITAL_COMMUNITY)
Admission: EM | Admit: 2023-03-03 | Discharge: 2023-03-03 | Disposition: A | Discharge status: Home / Self Care | Payer: Medicare (Managed Care) | Attending: Emergency Medicine | Admitting: Emergency Medicine

## 2023-03-03 DIAGNOSIS — E1165 Type 2 diabetes mellitus with hyperglycemia: Secondary | ICD-10-CM | POA: Diagnosis not present

## 2023-03-03 DIAGNOSIS — Z7984 Long term (current) use of oral hypoglycemic drugs: Secondary | ICD-10-CM | POA: Diagnosis not present

## 2023-03-03 DIAGNOSIS — R739 Hyperglycemia, unspecified: Secondary | ICD-10-CM | POA: Diagnosis present

## 2023-03-03 LAB — CBC WITH DIFFERENTIAL/PLATELET
Abs Immature Granulocytes: 0.03 10*3/uL (ref 0.00–0.07)
Basophils Absolute: 0 10*3/uL (ref 0.0–0.1)
Basophils Relative: 1 %
Eosinophils Absolute: 0.2 10*3/uL (ref 0.0–0.5)
Eosinophils Relative: 2 %
HCT: 37.6 % (ref 36.0–46.0)
Hemoglobin: 12.3 g/dL (ref 12.0–15.0)
Immature Granulocytes: 1 %
Lymphocytes Relative: 32 %
Lymphs Abs: 2 10*3/uL (ref 0.7–4.0)
MCH: 30 pg (ref 26.0–34.0)
MCHC: 32.7 g/dL (ref 30.0–36.0)
MCV: 91.7 fL (ref 80.0–100.0)
Monocytes Absolute: 0.6 10*3/uL (ref 0.1–1.0)
Monocytes Relative: 10 %
Neutro Abs: 3.4 10*3/uL (ref 1.7–7.7)
Neutrophils Relative %: 54 %
Platelets: 284 10*3/uL (ref 150–400)
RBC: 4.1 MIL/uL (ref 3.87–5.11)
RDW: 12.5 % (ref 11.5–15.5)
WBC: 6.2 10*3/uL (ref 4.0–10.5)
nRBC: 0 % (ref 0.0–0.2)

## 2023-03-03 LAB — CBG MONITORING, ED
Glucose-Capillary: 281 mg/dL — ABNORMAL HIGH (ref 70–99)
Glucose-Capillary: 210 mg/dL — ABNORMAL HIGH (ref 70–99)

## 2023-03-03 LAB — BASIC METABOLIC PANEL
Anion gap: 7 (ref 5–15)
BUN: 10 mg/dL (ref 8–23)
CO2: 24 mmol/L (ref 22–32)
Calcium: 8.8 mg/dL — ABNORMAL LOW (ref 8.9–10.3)
Chloride: 101 mmol/L (ref 98–111)
Creatinine, Ser: 0.55 mg/dL (ref 0.44–1.00)
GFR, Estimated: >60 mL/min (ref >60)
Glucose, Bld: 277 mg/dL — ABNORMAL HIGH (ref 70–99)
Potassium: 4.1 mmol/L (ref 3.5–5.1)
Sodium: 132 mmol/L — ABNORMAL LOW (ref 135–145)

## 2023-03-03 MED ORDER — SODIUM CHLORIDE 0.9 % IV BOLUS
1000.0000 mL | Freq: Once | INTRAVENOUS | Status: AC
  Administered 2023-03-03: 1000 mL via INTRAVENOUS

## 2023-03-03 NOTE — Discharge Instructions (Signed)
 Your workup today was overall reassuring.  Blood work was reassuring.  Your blood sugar at the end of the visit came down to 210.  Drink plenty of fluids.  Stay away from sweet drinks.  Follow-up with your doctor on Monday.  Continue taking the metformin.  If you have any concerning symptoms return to the emergency room. If you do not have a primary care doctor have listed a clinic above for you to establish care with.

## 2023-03-03 NOTE — ED Triage Notes (Signed)
 Pt checked her blood sugar this morning and it was 350, last night was 320. Taking metformin as prescribed. Denies NV

## 2023-03-03 NOTE — ED Provider Notes (Signed)
 [place] EMERGENCY DEPARTMENT AT [facility] Provider Note   CSN: [id number] Arrival date & time: 03/03/23  1233     History  Chief Complaint  Patient presents with   Hyperglycemia    Amber Hanna is a 73 y.o. female.  73 year old female presents today for concern of hyperglycemia.  She states she noted her blood sugar running in the mid 300s.  She noticed a chronic since last night.  She states she had 2 veggie patties last night that 2 different times.  She states that this morning she had oatmeal and half a banana.  She states she is only takes metformin and has been trying to avoid taking insulin.  She does not want to start insulin now.  She does endorse drinking a fruit juice which is sweet.  No soft drinks.  Does not drink enough water.  The history is provided by the patient. No language interpreter was used.       Home Medications Prior to Admission medications   Medication Sig Start Date End Date Taking? Authorizing Provider  amLODipine  (NORVASC ) 10 MG tablet Take 1 tablet (10 mg total) by mouth daily. 01/29/23   Armenta Canning, MD  baclofen (LIORESAL) 10 MG tablet Take 10 mg by mouth 3 (three) times daily.    [provider]  Brinzolamide-Brimonidine 1-0.2 % SUSP Place 1 drop into both eyes 3 (three) times daily.    [provider]  capsicum (ZOSTRIX) 0.075 % topical cream Apply 1 application topically 2 (two) times daily. 04/27/17   Haze Lonni JINNY, MD  gabapentin  (NEURONTIN ) 100 MG capsule Take 1 tablet on the first day, then take 1 tablet every 12 hours on the second day, then start taking 1 tablet 3 times a day after that 09/09/21   Nivia Colon, PA-C  levothyroxine  (SYNTHROID ) 88 MCG tablet Take 1 tablet (88 mcg total) by mouth daily before breakfast. 01/29/23   Armenta Canning, MD  levothyroxine  (SYNTHROID , LEVOTHROID) 88 MCG tablet Take 88 mcg by mouth daily before breakfast.    [provider]  lisinopril   (PRINIVIL ,ZESTRIL ) 20 MG tablet Take 20 mg by mouth daily.    [provider]  lisinopril  (ZESTRIL ) 20 MG tablet Take 1 tablet (20 mg total) by mouth daily. 01/29/23   Armenta Canning, MD  metFORMIN (GLUCOPHAGE) 1000 MG tablet Take 1,000 mg by mouth 2 (two) times daily with a meal.    [provider]      Allergies    Penicillins and Penicillin g    Review of Systems   Review of Systems  Constitutional:  Negative for chills and fever.  Gastrointestinal:  Negative for abdominal pain.  Endocrine: Negative for polyuria.  Neurological:  Negative for light-headedness.  All other systems reviewed and are negative.   Physical Exam Updated Vital Signs BP (!) 154/100 (BP Location: Right Arm)   Pulse 84   Temp 98.5 F (36.9 C) (Oral)   Resp 16   SpO2 98%  Physical Exam Vitals and nursing note reviewed.  Constitutional:      General: She is not in acute distress.    Appearance: Normal appearance. She is not ill-appearing.  HENT:     Head: Normocephalic and atraumatic.     Nose: Nose normal.  Eyes:     General: No scleral icterus.    Extraocular Movements: Extraocular movements intact.     Conjunctiva/sclera: Conjunctivae normal.  Cardiovascular:     Rate and Rhythm: Normal rate and regular rhythm.  Heart sounds: Normal heart sounds.  Pulmonary:     Effort: Pulmonary effort is normal. No respiratory distress.     Breath sounds: Normal breath sounds. No wheezing or rales.  Abdominal:     General: There is no distension.     Tenderness: There is no abdominal tenderness. There is no guarding.  Musculoskeletal:        General: Normal range of motion.     Cervical back: Normal range of motion.  Skin:    General: Skin is warm and dry.  Neurological:     General: No focal deficit present.     Mental Status: She is alert. Mental status is at baseline.     ED Results / Procedures / Treatments   Labs (all labs ordered are listed, but only abnormal results are  displayed) Labs Reviewed  CBG MONITORING, ED - Abnormal; Notable for the following components:      Result Value   Glucose-Capillary 281 (*)    All other components within normal limits  CBC WITH DIFFERENTIAL/PLATELET  BASIC METABOLIC PANEL    EKG None  Radiology No results found.  Procedures Procedures    Medications Ordered in ED Medications  sodium chloride  0.9 % bolus 1,000 mL (has no administration in time range)    ED Course/ Medical Decision Making/ A&P                                 Medical Decision Making Amount and/or Complexity of Data Reviewed Labs: ordered.   73 year old female presents today for concern of elevated blood sugar.  She states she has been running about 350 earlier today.  Denies any symptoms.  She is on metformin which she is compliant with.  She is not on any insulin.  Will obtain basic blood work, repeat CBG, provide fluids.  CBC unremarkable.  BMP with glucose of 132, glucose of 277.  It is downtrending.  Will reevaluate after fluids.  Patient received half a liter of fluid so far.  Repeat CBG at this point is 210.  Do not feel that she requires insulin.  Will complete the rest of the fluid bolus.  She is stable for discharge.  She will continue to monitor her blood glucose.  Strict return precaution discussed.  She will follow-up with her PCP on Monday for ongoing monitoring and management of her diabetes.  She is a type II diabetic.  Return precaution discussed.  Patient voices understanding and is in agreement with plan.     Final Clinical Impression(s) / ED Diagnoses Final diagnoses:  Hyperglycemia    Rx / DC Orders ED Discharge Orders     None         Hildegard Loge, PA-C 03/03/23 1440    Kingsley, Victoria K, [place] 03/03/23 1533
